# Patient Record
Sex: Male | Born: 1960
Health system: Southern US, Community
[De-identification: ages and names within clinical notes are randomized; demographics above are authoritative.]

## PROBLEM LIST (undated history)

## (undated) DIAGNOSIS — K859 Acute pancreatitis without necrosis or infection, unspecified: Secondary | ICD-10-CM

## (undated) DIAGNOSIS — W3400XA Accidental discharge from unspecified firearms or gun, initial encounter: Secondary | ICD-10-CM

## (undated) DIAGNOSIS — B1920 Unspecified viral hepatitis C without hepatic coma: Secondary | ICD-10-CM

## (undated) HISTORY — PX: OTHER SURGICAL HISTORY: SHX169

---

## 1997-08-10 ENCOUNTER — Emergency Department (HOSPITAL_COMMUNITY): Admission: EM | Admit: 1997-08-10 | Discharge: 1997-08-10 | Payer: Self-pay

## 1997-11-25 ENCOUNTER — Emergency Department (HOSPITAL_COMMUNITY): Admission: EM | Admit: 1997-11-25 | Discharge: 1997-11-25 | Payer: Self-pay | Admitting: Emergency Medicine

## 1998-01-09 ENCOUNTER — Emergency Department (HOSPITAL_COMMUNITY): Admission: EM | Admit: 1998-01-09 | Discharge: 1998-01-09 | Payer: Self-pay | Admitting: Emergency Medicine

## 1998-01-28 ENCOUNTER — Inpatient Hospital Stay (HOSPITAL_COMMUNITY): Admission: EM | Admit: 1998-01-28 | Discharge: 1998-01-30 | Payer: Self-pay | Admitting: Emergency Medicine

## 1998-01-28 ENCOUNTER — Encounter: Payer: Self-pay | Admitting: Emergency Medicine

## 1998-02-26 ENCOUNTER — Ambulatory Visit (HOSPITAL_COMMUNITY): Admission: RE | Admit: 1998-02-26 | Discharge: 1998-02-26 | Payer: Self-pay | Admitting: Gastroenterology

## 1998-02-26 ENCOUNTER — Encounter: Payer: Self-pay | Admitting: Gastroenterology

## 1998-03-03 ENCOUNTER — Ambulatory Visit (HOSPITAL_COMMUNITY): Admission: RE | Admit: 1998-03-03 | Discharge: 1998-03-03 | Payer: Self-pay | Admitting: Gastroenterology

## 1998-06-04 ENCOUNTER — Observation Stay (HOSPITAL_COMMUNITY): Admission: EM | Admit: 1998-06-04 | Discharge: 1998-06-06 | Payer: Self-pay | Admitting: Emergency Medicine

## 1998-06-04 ENCOUNTER — Encounter: Payer: Self-pay | Admitting: *Deleted

## 1998-07-17 ENCOUNTER — Encounter: Payer: Self-pay | Admitting: Emergency Medicine

## 1998-07-17 ENCOUNTER — Inpatient Hospital Stay (HOSPITAL_COMMUNITY): Admission: EM | Admit: 1998-07-17 | Discharge: 1998-07-18 | Payer: Self-pay | Admitting: Emergency Medicine

## 1998-09-06 ENCOUNTER — Inpatient Hospital Stay (HOSPITAL_COMMUNITY): Admission: EM | Admit: 1998-09-06 | Discharge: 1998-09-10 | Payer: Self-pay | Admitting: Emergency Medicine

## 1999-01-27 ENCOUNTER — Emergency Department (HOSPITAL_COMMUNITY): Admission: EM | Admit: 1999-01-27 | Discharge: 1999-01-27 | Payer: Self-pay | Admitting: Emergency Medicine

## 1999-01-27 ENCOUNTER — Encounter: Payer: Self-pay | Admitting: Emergency Medicine

## 1999-03-10 ENCOUNTER — Emergency Department (HOSPITAL_COMMUNITY): Admission: EM | Admit: 1999-03-10 | Discharge: 1999-03-10 | Payer: Self-pay | Admitting: Emergency Medicine

## 1999-05-13 ENCOUNTER — Inpatient Hospital Stay (HOSPITAL_COMMUNITY): Admission: EM | Admit: 1999-05-13 | Discharge: 1999-05-18 | Payer: Self-pay | Admitting: Emergency Medicine

## 1999-05-14 ENCOUNTER — Encounter: Payer: Self-pay | Admitting: Cardiology

## 1999-05-16 ENCOUNTER — Encounter: Payer: Self-pay | Admitting: Internal Medicine

## 1999-07-01 ENCOUNTER — Emergency Department (HOSPITAL_COMMUNITY): Admission: EM | Admit: 1999-07-01 | Discharge: 1999-07-01 | Payer: Self-pay | Admitting: Emergency Medicine

## 1999-08-25 ENCOUNTER — Inpatient Hospital Stay (HOSPITAL_COMMUNITY): Admission: EM | Admit: 1999-08-25 | Discharge: 1999-08-26 | Payer: Self-pay

## 1999-10-08 ENCOUNTER — Observation Stay (HOSPITAL_COMMUNITY): Admission: EM | Admit: 1999-10-08 | Discharge: 1999-10-09 | Payer: Self-pay | Admitting: Emergency Medicine

## 1999-10-30 ENCOUNTER — Emergency Department (HOSPITAL_COMMUNITY): Admission: EM | Admit: 1999-10-30 | Discharge: 1999-10-30 | Payer: Self-pay | Admitting: Emergency Medicine

## 2000-01-24 ENCOUNTER — Emergency Department (HOSPITAL_COMMUNITY): Admission: EM | Admit: 2000-01-24 | Discharge: 2000-01-24 | Payer: Self-pay | Admitting: Internal Medicine

## 2000-01-24 ENCOUNTER — Emergency Department (HOSPITAL_COMMUNITY): Admission: EM | Admit: 2000-01-24 | Discharge: 2000-01-24 | Payer: Self-pay | Admitting: Emergency Medicine

## 2000-02-14 ENCOUNTER — Encounter: Admission: RE | Admit: 2000-02-14 | Discharge: 2000-02-14 | Payer: Self-pay | Admitting: Family Medicine

## 2000-02-14 ENCOUNTER — Encounter: Payer: Self-pay | Admitting: Family Medicine

## 2000-03-03 ENCOUNTER — Emergency Department (HOSPITAL_COMMUNITY): Admission: EM | Admit: 2000-03-03 | Discharge: 2000-03-03 | Payer: Self-pay | Admitting: Emergency Medicine

## 2000-05-31 ENCOUNTER — Inpatient Hospital Stay (HOSPITAL_COMMUNITY): Admission: EM | Admit: 2000-05-31 | Discharge: 2000-06-01 | Payer: Self-pay | Admitting: *Deleted

## 2000-06-07 ENCOUNTER — Inpatient Hospital Stay (HOSPITAL_COMMUNITY): Admission: EM | Admit: 2000-06-07 | Discharge: 2000-06-09 | Payer: Self-pay | Admitting: *Deleted

## 2000-06-08 ENCOUNTER — Encounter: Payer: Self-pay | Admitting: Family Medicine

## 2000-09-12 ENCOUNTER — Inpatient Hospital Stay (HOSPITAL_COMMUNITY): Admission: EM | Admit: 2000-09-12 | Discharge: 2000-09-13 | Payer: Self-pay | Admitting: Emergency Medicine

## 2000-11-21 ENCOUNTER — Emergency Department (HOSPITAL_COMMUNITY): Admission: EM | Admit: 2000-11-21 | Discharge: 2000-11-22 | Payer: Self-pay | Admitting: Emergency Medicine

## 2001-02-12 ENCOUNTER — Encounter: Payer: Self-pay | Admitting: Emergency Medicine

## 2001-02-12 ENCOUNTER — Inpatient Hospital Stay (HOSPITAL_COMMUNITY): Admission: EM | Admit: 2001-02-12 | Discharge: 2001-02-14 | Payer: Self-pay | Admitting: Emergency Medicine

## 2001-06-13 ENCOUNTER — Inpatient Hospital Stay (HOSPITAL_COMMUNITY): Admission: EM | Admit: 2001-06-13 | Discharge: 2001-06-14 | Payer: Self-pay | Admitting: Emergency Medicine

## 2001-06-13 ENCOUNTER — Encounter: Payer: Self-pay | Admitting: Emergency Medicine

## 2001-09-04 ENCOUNTER — Emergency Department (HOSPITAL_COMMUNITY): Admission: EM | Admit: 2001-09-04 | Discharge: 2001-09-04 | Payer: Self-pay

## 2001-09-12 ENCOUNTER — Encounter: Payer: Self-pay | Admitting: Emergency Medicine

## 2001-09-12 ENCOUNTER — Inpatient Hospital Stay (HOSPITAL_COMMUNITY): Admission: EM | Admit: 2001-09-12 | Discharge: 2001-09-15 | Payer: Self-pay | Admitting: Emergency Medicine

## 2001-10-09 ENCOUNTER — Inpatient Hospital Stay (HOSPITAL_COMMUNITY): Admission: EM | Admit: 2001-10-09 | Discharge: 2001-10-11 | Payer: Self-pay | Admitting: *Deleted

## 2001-10-14 ENCOUNTER — Inpatient Hospital Stay (HOSPITAL_COMMUNITY): Admission: EM | Admit: 2001-10-14 | Discharge: 2001-10-17 | Payer: Self-pay | Admitting: Emergency Medicine

## 2001-10-14 ENCOUNTER — Encounter: Payer: Self-pay | Admitting: Emergency Medicine

## 2001-10-16 ENCOUNTER — Encounter: Payer: Self-pay | Admitting: Internal Medicine

## 2001-11-07 ENCOUNTER — Encounter: Payer: Self-pay | Admitting: Emergency Medicine

## 2001-11-08 ENCOUNTER — Inpatient Hospital Stay (HOSPITAL_COMMUNITY): Admission: EM | Admit: 2001-11-08 | Discharge: 2001-11-13 | Payer: Self-pay | Admitting: Internal Medicine

## 2002-01-11 ENCOUNTER — Encounter: Payer: Self-pay | Admitting: Internal Medicine

## 2002-01-11 ENCOUNTER — Inpatient Hospital Stay (HOSPITAL_COMMUNITY): Admission: EM | Admit: 2002-01-11 | Discharge: 2002-01-13 | Payer: Self-pay | Admitting: Emergency Medicine

## 2002-01-22 ENCOUNTER — Inpatient Hospital Stay (HOSPITAL_COMMUNITY): Admission: EM | Admit: 2002-01-22 | Discharge: 2002-01-25 | Payer: Self-pay

## 2002-01-22 ENCOUNTER — Encounter: Payer: Self-pay | Admitting: Internal Medicine

## 2002-01-23 ENCOUNTER — Encounter: Payer: Self-pay | Admitting: Internal Medicine

## 2002-02-01 ENCOUNTER — Emergency Department (HOSPITAL_COMMUNITY): Admission: EM | Admit: 2002-02-01 | Discharge: 2002-02-01 | Payer: Self-pay | Admitting: Emergency Medicine

## 2002-03-27 ENCOUNTER — Inpatient Hospital Stay (HOSPITAL_COMMUNITY): Admission: EM | Admit: 2002-03-27 | Discharge: 2002-03-29 | Payer: Self-pay | Admitting: *Deleted

## 2002-03-27 ENCOUNTER — Encounter: Payer: Self-pay | Admitting: Internal Medicine

## 2002-04-03 ENCOUNTER — Emergency Department (HOSPITAL_COMMUNITY): Admission: EM | Admit: 2002-04-03 | Discharge: 2002-04-03 | Payer: Self-pay | Admitting: Emergency Medicine

## 2002-04-11 ENCOUNTER — Emergency Department (HOSPITAL_COMMUNITY): Admission: EM | Admit: 2002-04-11 | Discharge: 2002-04-11 | Payer: Self-pay | Admitting: Emergency Medicine

## 2002-07-02 ENCOUNTER — Inpatient Hospital Stay (HOSPITAL_COMMUNITY): Admission: EM | Admit: 2002-07-02 | Discharge: 2002-07-07 | Payer: Self-pay | Admitting: Emergency Medicine

## 2002-10-02 ENCOUNTER — Inpatient Hospital Stay (HOSPITAL_COMMUNITY): Admission: EM | Admit: 2002-10-02 | Discharge: 2002-10-04 | Payer: Self-pay | Admitting: Emergency Medicine

## 2003-01-09 ENCOUNTER — Inpatient Hospital Stay (HOSPITAL_COMMUNITY): Admission: EM | Admit: 2003-01-09 | Discharge: 2003-01-11 | Payer: Self-pay | Admitting: Emergency Medicine

## 2003-01-14 ENCOUNTER — Inpatient Hospital Stay (HOSPITAL_COMMUNITY): Admission: EM | Admit: 2003-01-14 | Discharge: 2003-01-17 | Payer: Self-pay | Admitting: Internal Medicine

## 2003-03-16 ENCOUNTER — Emergency Department (HOSPITAL_COMMUNITY): Admission: EM | Admit: 2003-03-16 | Discharge: 2003-03-16 | Payer: Self-pay | Admitting: Emergency Medicine

## 2003-04-14 ENCOUNTER — Inpatient Hospital Stay (HOSPITAL_COMMUNITY): Admission: EM | Admit: 2003-04-14 | Discharge: 2003-04-17 | Payer: Self-pay | Admitting: Emergency Medicine

## 2003-05-16 ENCOUNTER — Inpatient Hospital Stay (HOSPITAL_COMMUNITY): Admission: EM | Admit: 2003-05-16 | Discharge: 2003-05-19 | Payer: Self-pay | Admitting: Emergency Medicine

## 2003-06-18 ENCOUNTER — Emergency Department (HOSPITAL_COMMUNITY): Admission: EM | Admit: 2003-06-18 | Discharge: 2003-06-19 | Payer: Self-pay | Admitting: Emergency Medicine

## 2003-07-25 ENCOUNTER — Emergency Department (HOSPITAL_COMMUNITY): Admission: EM | Admit: 2003-07-25 | Discharge: 2003-07-25 | Payer: Self-pay | Admitting: Emergency Medicine

## 2003-08-11 ENCOUNTER — Encounter
Admission: RE | Admit: 2003-08-11 | Discharge: 2003-11-09 | Payer: Self-pay | Admitting: Physical Medicine & Rehabilitation

## 2003-08-27 ENCOUNTER — Inpatient Hospital Stay (HOSPITAL_COMMUNITY): Admission: EM | Admit: 2003-08-27 | Discharge: 2003-08-29 | Payer: Self-pay | Admitting: Emergency Medicine

## 2003-09-23 ENCOUNTER — Ambulatory Visit: Payer: Self-pay | Admitting: Anesthesiology

## 2003-10-28 ENCOUNTER — Ambulatory Visit: Payer: Self-pay | Admitting: Anesthesiology

## 2004-03-02 ENCOUNTER — Inpatient Hospital Stay (HOSPITAL_COMMUNITY): Admission: EM | Admit: 2004-03-02 | Discharge: 2004-03-04 | Payer: Self-pay | Admitting: Emergency Medicine

## 2007-12-01 ENCOUNTER — Inpatient Hospital Stay (HOSPITAL_COMMUNITY): Admission: EM | Admit: 2007-12-01 | Discharge: 2007-12-06 | Payer: Self-pay | Admitting: Emergency Medicine

## 2009-05-14 ENCOUNTER — Ambulatory Visit: Payer: Self-pay | Admitting: Gastroenterology

## 2009-08-29 ENCOUNTER — Emergency Department (HOSPITAL_COMMUNITY): Admission: EM | Admit: 2009-08-29 | Discharge: 2009-08-30 | Payer: Self-pay | Admitting: Emergency Medicine

## 2010-01-24 ENCOUNTER — Encounter: Payer: Self-pay | Admitting: Gastroenterology

## 2010-01-30 ENCOUNTER — Inpatient Hospital Stay (HOSPITAL_COMMUNITY)
Admission: EM | Admit: 2010-01-30 | Discharge: 2010-02-02 | Disposition: A | Discharge status: Home / Self Care | Payer: Self-pay | Source: Home / Self Care | Attending: Internal Medicine | Admitting: Internal Medicine

## 2010-01-30 LAB — COMPREHENSIVE METABOLIC PANEL
AST: 85 U/L — ABNORMAL HIGH (ref 0–37)
Albumin: 4.9 g/dL (ref 3.5–5.2)
Calcium: 9.7 mg/dL (ref 8.4–10.5)
Chloride: 101 mEq/L (ref 96–112)
Creatinine, Ser: 1.14 mg/dL (ref 0.4–1.5)
GFR calc Af Amer: >60 mL/min (ref >60)
Sodium: 140 mEq/L (ref 135–145)

## 2010-01-30 LAB — CBC
HCT: 44.4 % (ref 39.0–52.0)
MCH: 31.5 pg (ref 26.0–34.0)
MCHC: 34.9 g/dL (ref 30.0–36.0)
MCV: 90.2 fL (ref 78.0–100.0)
RDW: 13.7 % (ref 11.5–15.5)

## 2010-01-30 LAB — DIFFERENTIAL
Eosinophils Relative: 1 % (ref 0–5)
Lymphocytes Relative: 28 % (ref 12–46)
Lymphs Abs: 1.7 10*3/uL (ref 0.7–4.0)
Monocytes Absolute: 0.3 10*3/uL (ref 0.1–1.0)

## 2010-01-31 LAB — CBC
Hemoglobin: 13.8 g/dL (ref 13.0–17.0)
MCH: 30.6 pg (ref 26.0–34.0)
MCV: 90.5 fL (ref 78.0–100.0)
RBC: 4.51 MIL/uL (ref 4.22–5.81)

## 2010-01-31 LAB — COMPREHENSIVE METABOLIC PANEL
AST: 66 U/L — ABNORMAL HIGH (ref 0–37)
CO2: 27 mEq/L (ref 19–32)
Chloride: 103 mEq/L (ref 96–112)
Creatinine, Ser: 1.15 mg/dL (ref 0.4–1.5)
GFR calc Af Amer: >60 mL/min (ref >60)
GFR calc non Af Amer: >60 mL/min (ref >60)
Total Bilirubin: 1.4 mg/dL — ABNORMAL HIGH (ref 0.3–1.2)

## 2010-02-01 LAB — URINALYSIS, ROUTINE W REFLEX MICROSCOPIC
Specific Gravity, Urine: 1.009 (ref 1.005–1.030)
Urine Glucose, Fasting: NEGATIVE mg/dL
pH: 6.5 (ref 5.0–8.0)

## 2010-02-01 LAB — COMPREHENSIVE METABOLIC PANEL
Albumin: 3.3 g/dL — ABNORMAL LOW (ref 3.5–5.2)
Alkaline Phosphatase: 54 U/L (ref 39–117)
BUN: 11 mg/dL (ref 6–23)
Calcium: 8.7 mg/dL (ref 8.4–10.5)
Creatinine, Ser: 1.22 mg/dL (ref 0.4–1.5)
Potassium: 4.6 mEq/L (ref 3.5–5.1)
Total Protein: 7.1 g/dL (ref 6.0–8.3)

## 2010-02-02 LAB — COMPREHENSIVE METABOLIC PANEL
ALT: 71 U/L — ABNORMAL HIGH (ref 0–53)
AST: 64 U/L — ABNORMAL HIGH (ref 0–37)
Alkaline Phosphatase: 54 U/L (ref 39–117)
CO2: 29 mEq/L (ref 19–32)
GFR calc Af Amer: >60 mL/min (ref >60)
GFR calc non Af Amer: >60 mL/min (ref >60)
Glucose, Bld: 89 mg/dL (ref 70–99)
Potassium: 4.6 mEq/L (ref 3.5–5.1)
Sodium: 140 mEq/L (ref 135–145)

## 2010-02-02 LAB — CBC
HCT: 35.9 % — ABNORMAL LOW (ref 39.0–52.0)
Hemoglobin: 12.1 g/dL — ABNORMAL LOW (ref 13.0–17.0)
RBC: 4 MIL/uL — ABNORMAL LOW (ref 4.22–5.81)
WBC: 3.6 10*3/uL — ABNORMAL LOW (ref 4.0–10.5)
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
## 2010-03-19 LAB — DIFFERENTIAL
Basophils Absolute: 0 10*3/uL (ref 0.0–0.1)
Lymphocytes Relative: 19 % (ref 12–46)
Lymphs Abs: 1.3 10*3/uL (ref 0.7–4.0)
Neutro Abs: 5.2 10*3/uL (ref 1.7–7.7)
Neutrophils Relative %: 74 % (ref 43–77)

## 2010-03-19 LAB — CBC
Hemoglobin: 14.6 g/dL (ref 13.0–17.0)
MCH: 31.5 pg (ref 26.0–34.0)
MCHC: 34.3 g/dL (ref 30.0–36.0)
MCV: 91.9 fL (ref 78.0–100.0)
RBC: 4.65 MIL/uL (ref 4.22–5.81)

## 2010-03-19 LAB — COMPREHENSIVE METABOLIC PANEL
BUN: 14 mg/dL (ref 6–23)
CO2: 30 mEq/L (ref 19–32)
Calcium: 9.2 mg/dL (ref 8.4–10.5)
Chloride: 104 mEq/L (ref 96–112)
Creatinine, Ser: 1.18 mg/dL (ref 0.4–1.5)
GFR calc non Af Amer: >60 mL/min (ref >60)
Total Bilirubin: 1.1 mg/dL (ref 0.3–1.2)

## 2010-03-19 LAB — LIPASE, BLOOD: Lipase: 536 U/L — ABNORMAL HIGH (ref 11–59)
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
## 2010-05-21 NOTE — Discharge Summary (Signed)
   NAME:  [name], [name]                          ACCOUNT NO.:  [id number]   MEDICAL RECORD NO.:  [id number]                   PATIENT TYPE:  INP   LOCATION:  5524                                 FACILITY:  [facility]   PHYSICIAN:  Merlene Laughter. Renae Gloss, M.D.           DATE OF BIRTH:  September 19, 1960   DATE OF ADMISSION:  11/08/2001  DATE OF DISCHARGE:  11/13/2001                                 DISCHARGE SUMMARY   DISCHARGE DIAGNOSES:  1. Acute pancreatitis.  2. Constipation.   HOSPITAL COURSE:  The patient was admitted for further evaluation and  treatment of acute pancreatitis.  He has history of  chronic pancreatitis as  a result of an abdominal injury years ago.  Over the last several months,  however, he noted recurrent of acute symptoms, which included nausea,  vomiting and mid epigastric abdominal pain.  Upon admission, his lipase was  261 with a amylase of 459.  He tolerated the Demerol and Duragesic patch  without complications. At the time of his discharge the amylase was 70 with  a lipase of 22.  He was able to eat a regular meal without complications.  He stated that his pain was at baseline and he was able to control his pain  with his outpatient oral regimen.   He had constipation during this admission which was relieved with Dulcolax  and enema.  Constipation is most likely secondary to narcotic medications.   DISCHARGE MEDICATIONS:  Duragesic patch 100 micrograms every three days.   FOLLOW UP:  The patient will be seen by Dr. Andi Devon within two  weeks following discharge.                                               Merlene Laughter. Renae Gloss, M.D.    KRS/MEDQ  D:  01/24/2002  T:  01/25/2002  Job:  [id number]
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
## 2010-05-21 NOTE — Discharge Summary (Signed)
NAME:  ERROLL, WILBOURNE                          ACCOUNT NO.:  [id number]   MEDICAL RECORD NO.:  [id number]                   PATIENT TYPE:  INP   LOCATION:  0467                                 FACILITY:  [facility]   PHYSICIAN:  Merlene Laughter. Renae Gloss, M.D.           DATE OF BIRTH:  December 29, 1960   DATE OF ADMISSION:  05/16/2003  DATE OF DISCHARGE:  05/19/2003                                 DISCHARGE SUMMARY   DISCHARGE DIAGNOSIS:  Acute pancreatitis.   HOSPITAL COURSE:  Mr. Vazquez was admitted for further treatment of acute  pancreatitis. He has a history of chronic pancreatitis with acute  exacerbations with his most recent hospitalization on April 17, 2003. He was  able to tolerate oral intake and his pain resolved with IV Demerol.   At the time of discharge, Mr. Fanning was pain-free and he was able to tolerate  a regular diet.   DISCHARGE MEDICATIONS:  1. Methadone 10 mg p.o. b.i.d. p.r.n.  2. Mepergan one p.o. b.i.d. p.r.n.   FOLLOWUP:  Mr. President will be seen by Dr. Andi Devon within two weeks  following discharge.                                               Merlene Laughter. Renae Gloss, M.D.    KRS/MEDQ  D:  05/19/2003  T:  05/20/2003  Job:  [id number]
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
## 2010-05-21 NOTE — Discharge Summary (Signed)
   NAME:  [name], David                          ACCOUNT NO.:  [id number]   MEDICAL RECORD NO.:  [id number]                   PATIENT TYPE:  INP   LOCATION:  0357                                 FACILITY:  [facility]   PHYSICIAN:  Merlene Laughter. Renae Gloss, M.D.           DATE OF BIRTH:  Mar 19, 1960   DATE OF ADMISSION:  03/27/2002  DATE OF DISCHARGE:  03/29/2002                                 DISCHARGE SUMMARY   DISCHARGE DIAGNOSES:  1. Acute pancreatitis.  2. Epigastric abdominal pain secondary to #1.   HOSPITAL COURSE:  The patient was admitted with acute exacerbation of  pancreatitis.  He does have a history of chronic pancreatitis with chronic  pain syndrome requiring low-dose methadone as an outpatient.  Upon  admission, he had an amylase of 344, lipase of 232.  With bowel rest and IV  narcotics, his symptoms did resolve.  His discharge amylase and lipase were  82 and 27, respectively.   DISPOSITION:  The patient was tolerating oral intake, was ambulating without  complications.   DISCHARGE MEDICATIONS:  Methadone 10 mg p.o. b.i.d. p.r.n.   FOLLOWUP:  The patient will be seen by Dr. Renae Gloss within two weeks  following discharge.                                               Merlene Laughter. Renae Gloss, M.D.    KRS/MEDQ  D:  04/25/2002  T:  04/25/2002  Job:  [id number]
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
## 2010-05-21 NOTE — Procedures (Signed)
[name]                ACCOUNT NO.:  [id number]   MEDICAL RECORD NO.:  [id number]          PATIENT TYPE:  REC   LOCATION:  TPC                          FACILITY:  [facility]   PHYSICIAN:  Celene Kras, MD        DATE OF BIRTH:  02/23/1960   DATE OF PROCEDURE:  09/23/2003  DATE OF DISCHARGE:                                 OPERATIVE REPORT   PATIENT:  [name].   DATE OF BIRTH:  [birth year].   SURGEON:  Jewel Baize. Stevphen Rochester, M.D.   [name] and I evaluated  him. I have reviewed the Health and history form. I reviewed the 14 point  review of systems.   1.  [name] comes to [place] today for his scheduled celiac plexus block,      aortorenal plexus block. This procedure is discussed with him within the      context of risks, complications, and options, and he wishes to proceed.      Predicate further injection based on need. Symptoms directed left      greater than right. No interval change.  2.  Utilizing medications sparingly and appropriately. Primarily is rescue      but as per prescribed. I reviewed these risks.  3.  Consider second injection if warranted. Consider ablation.   OBJECTIVE:  No significant change in neurological or musculoskeletal  presentation.   Incisionally, he is tender, myofascial presentation. Abdominal pain is  noted, mostly epigastrium and upper quadrant.   IMPRESSION:  Pancreatitis, chronic abdominal pain.   PLAN:  Celiac plexus block, aortorenal plexus component. He is consented.   The patient is taken to fluoroscopy suite and placed in prone position. Back  prepped and draped in usual fashion. Using a 22-gauge spinal needle under  local anesthetic, I advanced to the anterior surface of L1-2 with CSF, heme,  or paresthesia. There is no evidence that we have encroached upon neural  structures, and we confirmed placement of multiple fluoroscopic positions. I  then test blocked uneventfully and  followed with 4 cc of lidocaine 1% MPF  and 40 mg of Aristocort.   Contrast is not utilized secondary to allergy, although ___________, he is  most concerned. Needle placement was adequately assessed prior to injection.   He tolerated the procedure well. No complication from our procedure. Will  assess him in context of activities of daily living. We will see him in  followup.       HH/MEDQ  D:  09/23/2003 19:02:56  T:  09/25/2003 07:42:35  Job:  [id number]
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
## 2010-05-21 NOTE — Discharge Summary (Signed)
   NAME:  [name], [name]                          ACCOUNT NO.:  [id number]   MEDICAL RECORD NO.:  [id number]                   PATIENT TYPE:  INP   LOCATION:  0444                                 FACILITY:  [facility]   PHYSICIAN:  Georgina Quint. Plotnikov, M.D. [facility]      DATE OF BIRTH:  01/12/1960   DATE OF ADMISSION:  09/12/2001  DATE OF DISCHARGE:  09/15/2001                                 DISCHARGE SUMMARY   FINAL DIAGNOSES:  1. Recurrent exacerbation of chronic pancreatitis.  2. Abdominal pain due to problem #1.  3. History of hepatitis C.  4. History of mild thrombocytopenia.   HISTORY OF PRESENT ILLNESS:  The patient is a 50 year old male with chronic  pancreatitis currently, worked up previously at [facility] with a chronic  pain management here in [place] [employer] by Dr. Hal Hope.   CURRENT MEDICATIONS:  1. Methadone 10 mg one to two a day.  2. Mepergan 50/25 p.r.n.   HISTORY:  The patient is a 50 year old male with the above problems who  presented with exacerbation of abdominal pain on September 12, 2001.  For  the details, please address the History and Physical.   HOSPITAL COURSE:  During the course of hospitalization, he was treated with  IV Demerol and IV fluids, and IV Phenergan.  His condition has gradually  improved.  The lab work revealed elevated amylase and lipase.  It has  normalized.  On the day of discharge, he was doing well, temp 99.1, heart  rate 75, respirations 20, blood pressure 109/75.  He is feeling well, able  to eat and drink, abdomen soft and nontender, lungs clear, heart rate  regular, lower extremities without edema.  He was alert and cooperative.   LABORATORY AND ACCESSORY DATA:  White count 3.4, platelets 118, BMET normal,  hemoglobin 11.9 (was 13).  Initial amylase 515 with lipase 326, SGOT 78,  SGPT 79.  Urinalysis was negative.   His chest x-ray was clear.    SPECIAL INSTRUCTIONS:  1. GI consultation at [facility] with gastroenterology at his  request next     week.  2. Follow up with Dr. Hal Hope at pain clinic as usual.                                               Georgina Quint. Plotnikov, M.D. [facility]    AVP/MEDQ  D:  09/15/2001  T:  09/15/2001  Job:  [id number]   cc:   GI/Gastroenterology   Dr. Hal Hope  [facility] Clinic
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
## 2010-05-21 NOTE — Discharge Summary (Signed)
NAME:  [name], [name]                          ACCOUNT NO.:  [id number]   MEDICAL RECORD NO.:  [id number]                   PATIENT TYPE:  INP   LOCATION:  0450                                 FACILITY:  [facility]   PHYSICIAN:  Olene Craven, M.D.            DATE OF BIRTH:  03/04/1960   DATE OF ADMISSION:  01/08/2003  DATE OF DISCHARGE:  01/11/2003                                 DISCHARGE SUMMARY   DISCHARGE DIAGNOSIS:  Acute on chronic pancreatitis.   DISCHARGE MEDICATIONS:  1. Methadone 10 mg p.o. b.i.d.  2. Mepergan one tablet p.o. q.6h. p.r.n. breakthrough.  3. Phenergan 25 mg p.o. q.6h. p.r.n. breakthrough.   FOLLOWUP:  The patient is to keep his regularly scheduled appointment with  Dr. Andi Devon.   PROCEDURES:  None.   CONSULTATIONS:  None.   HOSPITAL COURSE:  The patient was admitted on January 09, 2003, after  experiencing worsening of abdominal discomfort, nausea and vomiting.  His  home medications were unable to alleviate the pain.  He was admitted for  further evaluation and noted to have elevated SGOT, SGPT, and lipase.  The  patient has had multiple visits for acute on chronic pancreatitis.  This is  similar to his previous admissions.  The patient responded rapidly and  appropriately with n.p.o. and IV hydration.  He was able to tolerate clear  liquids within the day of admission, and was discharged the following  morning on regular food.  It was not felt that a CT scan looking for  pseudocyst was needed at this time.  The patient is well rehearsed in  symptoms of pancreatitis, has had multiple admissions.  He knows if symptoms  worsen or do not improve he is to return to the office.  The patient is  discharged in stable condition.                                               Olene Craven, M.D.    DEH/MEDQ  D:  01/11/2003  T:  01/11/2003  Job:  [id number]
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
## 2010-05-21 NOTE — Procedures (Signed)
[name], [name]                ACCOUNT NO.:  [id number]   MEDICAL RECORD NO.:  [id number]          PATIENT TYPE:  REC   LOCATION:  TPC                          FACILITY:  [facility]   PHYSICIAN:  Celene Kras, MD        DATE OF BIRTH:  [birth year]   DATE OF PROCEDURE:  10/28/2003  DATE OF DISCHARGE:                                 OPERATIVE REPORT   PATIENT:  [name].   DATE OF BIRTH:  [birth year].   SURGEON:  Jewel Baize. Stevphen Rochester, M.D.   [name].  I evaluated him  via health and history form and 14-point review of systems.   1.  [name] has had improvement with the celiac plexus block, he has not had      to go to the ER, he has less of the 9 and significant pain that he has      had in the past.  2.  He is down to 10 mg of methadone, as he has brought himself down, and I      actually would like to switch him to an oxycodone preparation.  That      would be used p.r.n.  3.  I review methadone and its inherent risks.  4.  Consider another block if warranted, but if he is doing fine in three or      four weeks, we will probably just follow him along expectantly as a      reinforcement to the block should he have significant breakthrough.      Again, the risks, complications, and options of this procedure were      reviewed.  He wishes to proceed.   OBJECTIVE:  No significant change in neurologic or musculoskeletal  presentation.  Appears quiescent.  No peri-incisional discomfort, abdominal  pain modestly present, but no other changes noted.   IMPRESSION:  1.  Pancreatitis and diffuse abdominal pain, etiology unspecified.  2.  Abdominal pain possibly secondary to adhesions, results in central      amplification.  3.  Cannot rule out, although not a clear diagnosis, reflex sympathetic      dystrophy of the gut.  4.  He is consented for today's procedure.   PROCEDURE:  The patient is taken to the fluoroscopy suite and placed in  prone position, back prepped and draped in the usual fashion.  Using a 22-  gauge spinal needle, I advanced to the anterior surface of L2, without CSF,  heme, or paresthesia.  This is under local anesthetic.  The needle is then  migrated closer to L1, and Isovue 200 is utilized in multiple fluoroscopic  positions to confirm placement.  Test block uneventfully.  I follow with 6  mL of lidocaine 1% NPF and 40 mg of Aristocort.   He is appropriately recovered with vital signs stable and discharge  instructions are given.  No complications identified.  He will assess within  the context of activities of daily living, and we will see him in three  weeks.  HH/MEDQ  D:  10/28/2003 11:18:36  T:  10/28/2003 13:25:26  Job:  [id number]

## 2010-10-05 LAB — URINALYSIS, ROUTINE W REFLEX MICROSCOPIC
Bilirubin Urine: NEGATIVE
Leukocytes, UA: NEGATIVE
Nitrite: NEGATIVE
Specific Gravity, Urine: 1.027 (ref 1.005–1.030)
Urobilinogen, UA: 1 mg/dL (ref 0.0–1.0)

## 2010-10-05 LAB — MAGNESIUM: Magnesium: 2 mg/dL (ref 1.5–2.5)

## 2010-10-05 LAB — COMPREHENSIVE METABOLIC PANEL
Albumin: 3.3 g/dL — ABNORMAL LOW (ref 3.5–5.2)
Alkaline Phosphatase: 54 U/L (ref 39–117)
BUN: 5 mg/dL — ABNORMAL LOW (ref 6–23)
BUN: 8 mg/dL (ref 6–23)
Calcium: 9 mg/dL (ref 8.4–10.5)
Chloride: 100 mEq/L (ref 96–112)
Chloride: 104 mEq/L (ref 96–112)
Creatinine, Ser: 1.09 mg/dL (ref 0.4–1.5)
GFR calc Af Amer: >60 mL/min (ref >60)
GFR calc non Af Amer: >60 mL/min (ref >60)
Glucose, Bld: 112 mg/dL — ABNORMAL HIGH (ref 70–99)
Potassium: 3.7 mEq/L (ref 3.5–5.1)
Total Bilirubin: 0.7 mg/dL (ref 0.3–1.2)
Total Bilirubin: 0.8 mg/dL (ref 0.3–1.2)

## 2010-10-05 LAB — BASIC METABOLIC PANEL
BUN: 10 mg/dL (ref 6–23)
Calcium: 9.5 mg/dL (ref 8.4–10.5)
Chloride: 103 mEq/L (ref 96–112)
Creatinine, Ser: 1.23 mg/dL (ref 0.4–1.5)

## 2010-10-05 LAB — URINE MICROSCOPIC-ADD ON

## 2010-10-05 LAB — LIPID PANEL
LDL Cholesterol: 108 mg/dL — ABNORMAL HIGH (ref 0–99)
VLDL: 16 mg/dL (ref 0–40)

## 2010-10-05 LAB — CBC
HCT: 36.6 % — ABNORMAL LOW (ref 39.0–52.0)
Hemoglobin: 12.3 g/dL — ABNORMAL LOW (ref 13.0–17.0)
Hemoglobin: 12.9 g/dL — ABNORMAL LOW (ref 13.0–17.0)
Platelets: 158 10*3/uL (ref 150–400)
RBC: 4.04 MIL/uL — ABNORMAL LOW (ref 4.22–5.81)
RDW: 13 % (ref 11.5–15.5)
WBC: 4.6 10*3/uL (ref 4.0–10.5)

## 2010-10-05 LAB — DIFFERENTIAL
Basophils Absolute: 0 10*3/uL (ref 0.0–0.1)
Basophils Relative: 0 % (ref 0–1)
Neutro Abs: 2.1 10*3/uL (ref 1.7–7.7)
Neutrophils Relative %: 46 % (ref 43–77)

## 2010-10-05 LAB — HEPATIC FUNCTION PANEL
Alkaline Phosphatase: 62 U/L (ref 39–117)
Indirect Bilirubin: 0.5 mg/dL (ref 0.3–0.9)
Total Protein: 8.3 g/dL (ref 6.0–8.3)

## 2010-10-05 LAB — LIPASE, BLOOD: Lipase: 592 U/L — ABNORMAL HIGH (ref 11–59)

## 2010-10-05 LAB — AMYLASE: Amylase: 343 U/L — ABNORMAL HIGH (ref 27–131)

## 2010-10-08 LAB — CBC
Hemoglobin: 12.8 g/dL — ABNORMAL LOW (ref 13.0–17.0)
RBC: 4.09 MIL/uL — ABNORMAL LOW (ref 4.22–5.81)
WBC: 4.6 10*3/uL (ref 4.0–10.5)

## 2010-10-08 LAB — BASIC METABOLIC PANEL
Calcium: 8.9 mg/dL (ref 8.4–10.5)
Chloride: 103 mEq/L (ref 96–112)
Creatinine, Ser: 1.08 mg/dL (ref 0.4–1.5)
GFR calc Af Amer: >60 mL/min (ref >60)
Sodium: 135 mEq/L (ref 135–145)

## 2010-10-08 LAB — LIPASE, BLOOD: Lipase: 47 U/L (ref 11–59)

## 2011-05-10 ENCOUNTER — Encounter (HOSPITAL_COMMUNITY): Payer: Self-pay | Admitting: Emergency Medicine

## 2011-05-10 ENCOUNTER — Emergency Department (HOSPITAL_COMMUNITY)
Admission: EM | Admit: 2011-05-10 | Discharge: 2011-05-10 | Disposition: A | Discharge status: Home / Self Care | Payer: 59 | Attending: Emergency Medicine | Admitting: Emergency Medicine

## 2011-05-10 DIAGNOSIS — K5900 Constipation, unspecified: Secondary | ICD-10-CM | POA: Insufficient documentation

## 2011-05-10 DIAGNOSIS — K861 Other chronic pancreatitis: Secondary | ICD-10-CM

## 2011-05-10 DIAGNOSIS — Z79899 Other long term (current) drug therapy: Secondary | ICD-10-CM | POA: Insufficient documentation

## 2011-05-10 DIAGNOSIS — R110 Nausea: Secondary | ICD-10-CM | POA: Insufficient documentation

## 2011-05-10 DIAGNOSIS — R1013 Epigastric pain: Secondary | ICD-10-CM | POA: Insufficient documentation

## 2011-05-10 HISTORY — DX: Acute pancreatitis without necrosis or infection, unspecified: K85.90

## 2011-05-10 HISTORY — DX: Accidental discharge from unspecified firearms or gun, initial encounter: W34.00XA

## 2011-05-10 LAB — DIFFERENTIAL
Eosinophils Relative: 1 % (ref 0–5)
Lymphocytes Relative: 47 % — ABNORMAL HIGH (ref 12–46)
Lymphs Abs: 2.4 10*3/uL (ref 0.7–4.0)
Monocytes Absolute: 0.5 10*3/uL (ref 0.1–1.0)
Monocytes Relative: 10 % (ref 3–12)
Neutro Abs: 2.2 10*3/uL (ref 1.7–7.7)

## 2011-05-10 LAB — COMPREHENSIVE METABOLIC PANEL
AST: 48 U/L — ABNORMAL HIGH (ref 0–37)
Albumin: 4.4 g/dL (ref 3.5–5.2)
Calcium: 9.3 mg/dL (ref 8.4–10.5)
Chloride: 97 mEq/L (ref 96–112)
Creatinine, Ser: 1.02 mg/dL (ref 0.50–1.35)
Total Bilirubin: 1.2 mg/dL (ref 0.3–1.2)
Total Protein: 9.5 g/dL — ABNORMAL HIGH (ref 6.0–8.3)

## 2011-05-10 LAB — CBC
HCT: 43.6 % (ref 39.0–52.0)
Hemoglobin: 15.2 g/dL (ref 13.0–17.0)
MCV: 90.1 fL (ref 78.0–100.0)
RBC: 4.84 MIL/uL (ref 4.22–5.81)
WBC: 5.2 10*3/uL (ref 4.0–10.5)

## 2011-05-10 MED ORDER — PROMETHAZINE HCL 25 MG PO TABS: 25.0000 mg | ORAL_TABLET | Freq: Four times a day (QID) | ORAL | Status: DC | PRN

## 2011-05-10 MED ORDER — HYDROMORPHONE HCL 2 MG PO TABS: 2.0000 mg | ORAL_TABLET | ORAL | Status: AC | PRN

## 2011-05-10 MED ORDER — HYDROMORPHONE HCL PF 2 MG/ML IJ SOLN
2.0000 mg | Freq: Once | INTRAMUSCULAR | Status: AC
  Administered 2011-05-10: 2 mg via INTRAVENOUS
  Filled 2011-05-10: qty 1

## 2011-05-10 MED ORDER — PROMETHAZINE HCL 25 MG/ML IJ SOLN
25.0000 mg | Freq: Once | INTRAMUSCULAR | Status: AC
  Administered 2011-05-10: 25 mg via INTRAVENOUS
  Filled 2011-05-10: qty 1

## 2011-05-10 MED ORDER — SODIUM CHLORIDE 0.9 % IV BOLUS (SEPSIS)
1000.0000 mL | Freq: Once | INTRAVENOUS | Status: AC
  Administered 2011-05-10: 1000 mL via INTRAVENOUS
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
## 2011-05-10 NOTE — ED Notes (Signed)
Pt states he has pancreatitis and began to have pain on Friday  Pt states he takes methadone for the pain and when it does not control his pain he comes in  Pt has nausea without vomiting

## 2011-05-10 NOTE — ED Notes (Signed)
Pt's RA 02 dropped to 88 while patient was sleeping, placed Bryant on pt at 2L, 02 is now 100%

## 2011-12-17 ENCOUNTER — Emergency Department (HOSPITAL_COMMUNITY): Payer: 59

## 2011-12-17 ENCOUNTER — Emergency Department (HOSPITAL_COMMUNITY)
Admission: EM | Admit: 2011-12-17 | Discharge: 2011-12-18 | Disposition: A | Discharge status: Home / Self Care | Payer: 59 | Attending: Emergency Medicine | Admitting: Emergency Medicine

## 2011-12-17 ENCOUNTER — Encounter (HOSPITAL_COMMUNITY): Payer: Self-pay | Admitting: *Deleted

## 2011-12-17 DIAGNOSIS — Z87828 Personal history of other (healed) physical injury and trauma: Secondary | ICD-10-CM | POA: Insufficient documentation

## 2011-12-17 DIAGNOSIS — K859 Acute pancreatitis without necrosis or infection, unspecified: Secondary | ICD-10-CM | POA: Insufficient documentation

## 2011-12-17 LAB — CBC WITH DIFFERENTIAL/PLATELET
Basophils Absolute: 0 10*3/uL (ref 0.0–0.1)
Basophils Relative: 0 % (ref 0–1)
Eosinophils Relative: 0 % (ref 0–5)
HCT: 44 % (ref 39.0–52.0)
Hemoglobin: 15.3 g/dL (ref 13.0–17.0)
MCHC: 34.8 g/dL (ref 30.0–36.0)
MCV: 87.8 fL (ref 78.0–100.0)
Monocytes Absolute: 0.5 10*3/uL (ref 0.1–1.0)
Monocytes Relative: 6 % (ref 3–12)
RDW: 12.8 % (ref 11.5–15.5)

## 2011-12-17 LAB — COMPREHENSIVE METABOLIC PANEL
AST: 56 U/L — ABNORMAL HIGH (ref 0–37)
BUN: 14 mg/dL (ref 6–23)
CO2: 28 mEq/L (ref 19–32)
Calcium: 10 mg/dL (ref 8.4–10.5)
Chloride: 96 mEq/L (ref 96–112)
Creatinine, Ser: 1.1 mg/dL (ref 0.50–1.35)
GFR calc non Af Amer: 76 mL/min — ABNORMAL LOW (ref >90)
Total Bilirubin: 0.8 mg/dL (ref 0.3–1.2)

## 2011-12-17 LAB — LIPASE, BLOOD: Lipase: 214 U/L — ABNORMAL HIGH (ref 11–59)

## 2011-12-17 MED ORDER — HYDROMORPHONE HCL PF 1 MG/ML IJ SOLN
1.0000 mg | Freq: Once | INTRAMUSCULAR | Status: AC
  Administered 2011-12-17: 1 mg via INTRAVENOUS
  Filled 2011-12-17: qty 1

## 2011-12-17 MED ORDER — HYDROMORPHONE HCL PF 1 MG/ML IJ SOLN
2.0000 mg | Freq: Once | INTRAMUSCULAR | Status: AC
  Administered 2011-12-17: 2 mg via INTRAVENOUS
  Filled 2011-12-17: qty 2

## 2011-12-17 MED ORDER — SODIUM CHLORIDE 0.9 % IV SOLN
INTRAVENOUS | Status: DC
  Administered 2011-12-17 (×2): via INTRAVENOUS

## 2011-12-17 MED ORDER — ONDANSETRON HCL 4 MG/2ML IJ SOLN
4.0000 mg | Freq: Once | INTRAMUSCULAR | Status: AC
  Administered 2011-12-17: 4 mg via INTRAVENOUS
  Filled 2011-12-17: qty 2

## 2011-12-17 MED ORDER — ONDANSETRON 8 MG PO TBDP: 8.0000 mg | ORAL_TABLET | Freq: Three times a day (TID) | ORAL | Status: DC | PRN

## 2011-12-17 MED ORDER — OXYCODONE-ACETAMINOPHEN 7.5-500 MG PO TABS: 1.0000 | ORAL_TABLET | ORAL | Status: DC | PRN

## 2011-12-17 NOTE — ED Notes (Signed)
IV attempt x2, unsuccessful. Barbara Cower, RN requested to bedside for assistance.
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
## 2011-12-17 NOTE — ED Notes (Signed)
MD at bedside. 
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
## 2011-12-17 NOTE — ED Notes (Signed)
Patient completed contrast.

## 2011-12-17 NOTE — ED Notes (Signed)
Dr Freida Busman reported that patient was difficult to arouse, requested patient be placed on monitor. Patient O2 sat decreased 65-70% initially, patient placed on 10L O2 via face mask and titrated down to 5L after 3 minutes. Patient currently on 3L O2 via [place]. O2 sats 100% at this time.

## 2011-12-18 MED ORDER — OXYCODONE-ACETAMINOPHEN 5-325 MG PO TABS
2.0000 | ORAL_TABLET | Freq: Once | ORAL | Status: AC
  Administered 2011-12-18: 2 via ORAL
  Filled 2011-12-18: qty 2

## 2011-12-18 NOTE — ED Notes (Signed)
Patient asleep with audible inspiratory wheezes, difficult to arouse, required sternal rub. Patient awakened, repositioned self in bed. Denies pain. VSS on 2LO2NC. Patient denies OSA, Asthma, or any other respiratory difficulties. MD notified, will con't to monitor.

## 2011-12-18 NOTE — ED Notes (Signed)
Dr Read Drivers to bedside for evaluation. Patient coughed, cleared throat and "wheezing" cleared. Patient oriented to situation, advised patient when he becomes able to remain more alert, and able to be taken off O2 he will be discharged.

## 2011-12-18 NOTE — ED Provider Notes (Signed)
4:54 AM Patient is now awake and alert. The pain is well controlled. He states he is ready to go home. His oxygen saturation is 100% on room air.   Hanley Seamen, MD 12/18/11 [phone]

## 2012-01-06 IMAGING — CT CT ABD-PELV W/O CM
2 of 4 series · 17 of 46 positions shown, 19 images · non-contrast
Comparison: 12/01/2007

CLINICAL DATA: Abdominal pain.  History of pancreatitis.  Elevated
lipase and liver function test.

CT ABDOMEN AND PELVIS WITHOUT CONTRAST
TECHNIQUE: Multidetector CT imaging of the abdomen and pelvis was
performed following the standard protocol without intravenous
contrast.

[Series 2: abd_pel 5.0 b40f st · axial · 0.73mm/px · z∈[-446,-36]mm · 14 of 90 slices shown, 16 images]
[im 4/90  soft-tissue]
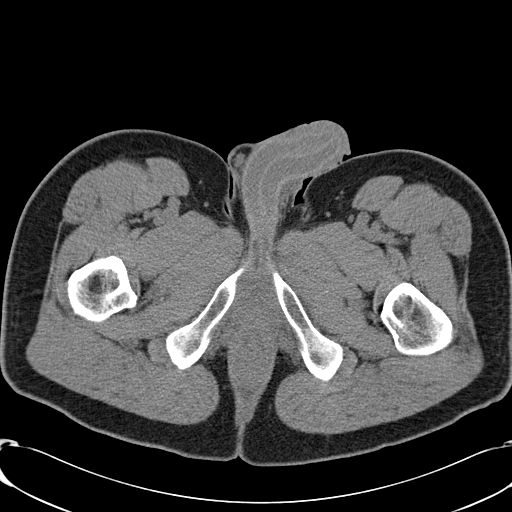
[im 4/90  bone]
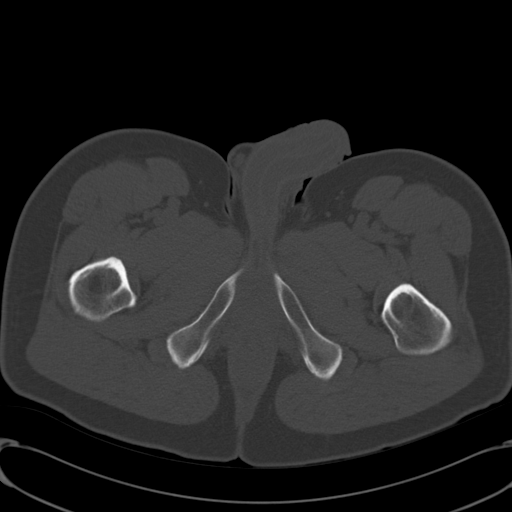
[im 10/90  soft-tissue]
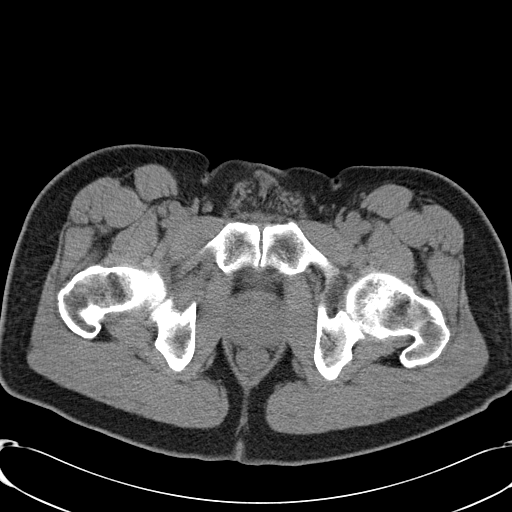
[im 16/90  soft-tissue]
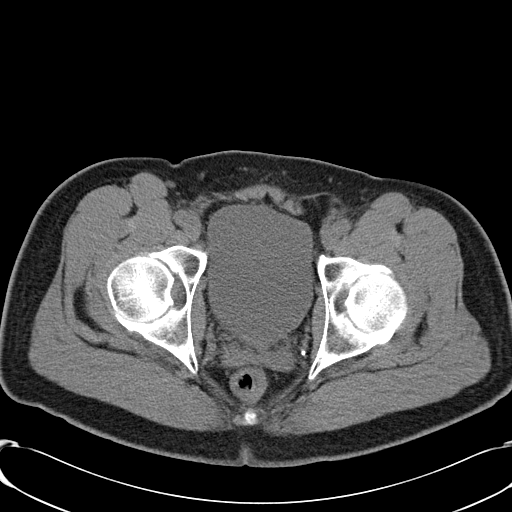
[im 23/90  soft-tissue]
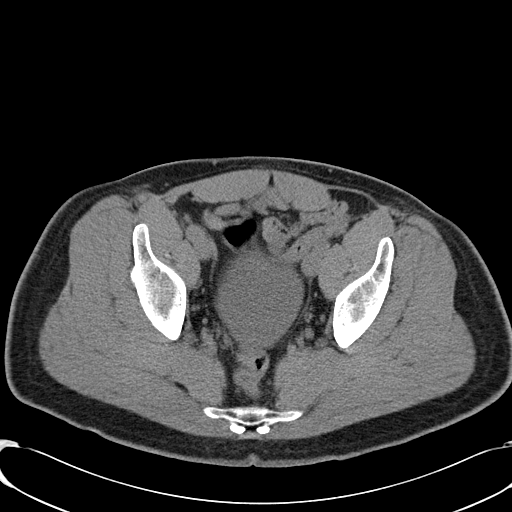
[im 29/90  soft-tissue]
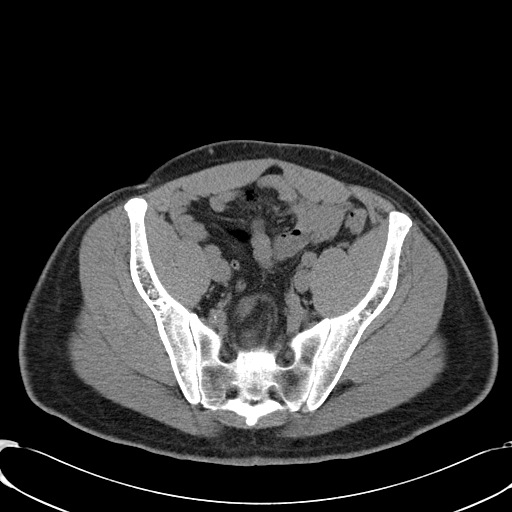
[im 35/90  soft-tissue]
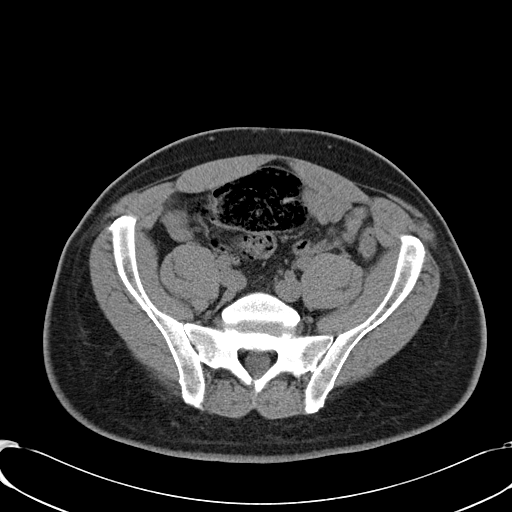
[im 42/90  soft-tissue]
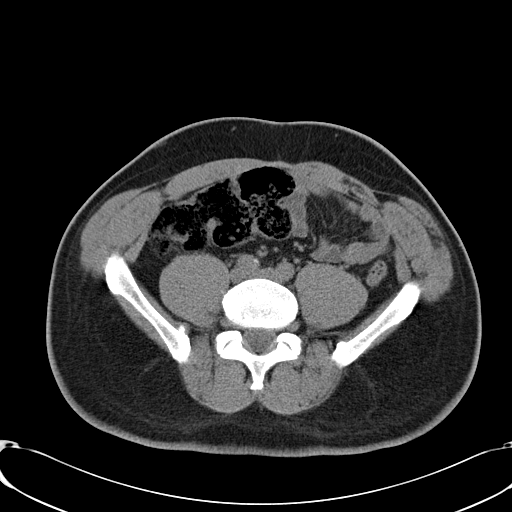
[im 48/90  soft-tissue]
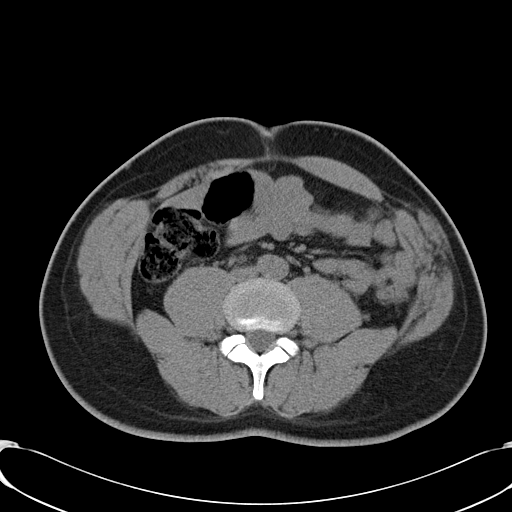
[im 55/90  soft-tissue]
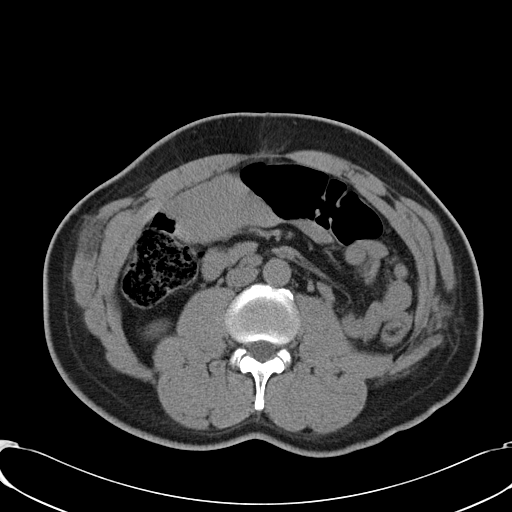
[im 55/90  bone]
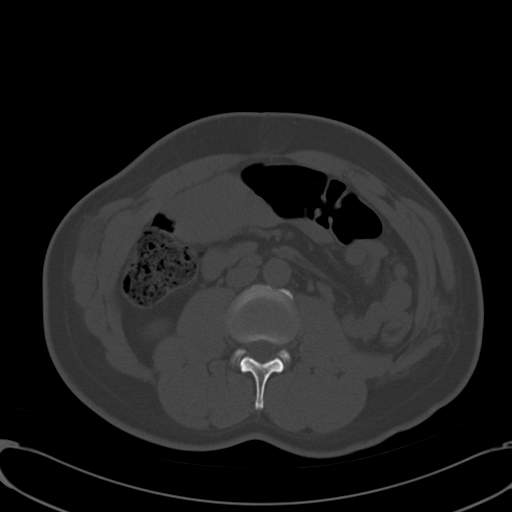
[im 61/90  soft-tissue]
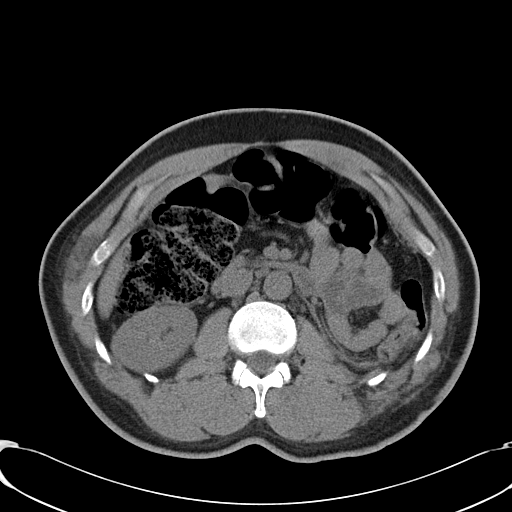
[im 67/90  soft-tissue]
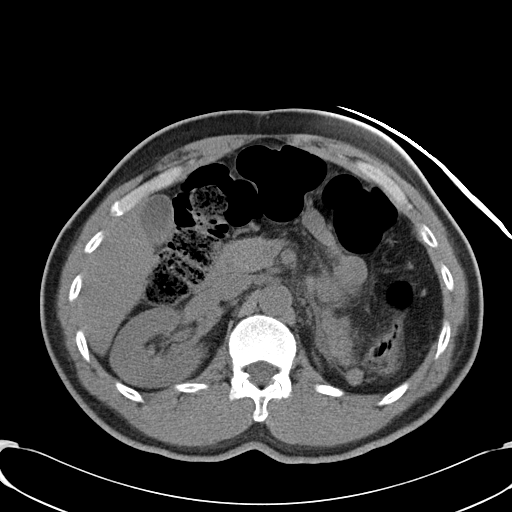
[im 74/90  soft-tissue]
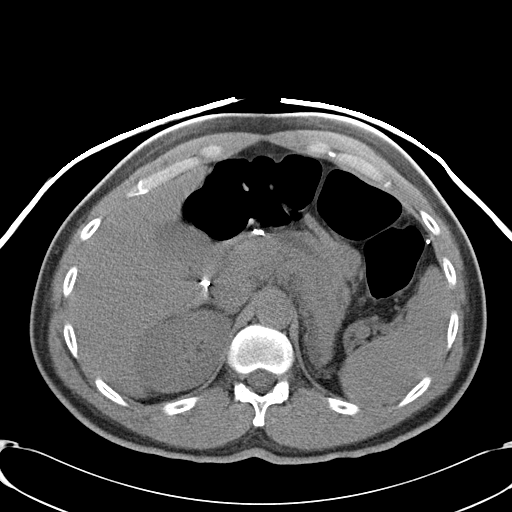
[im 80/90  soft-tissue]
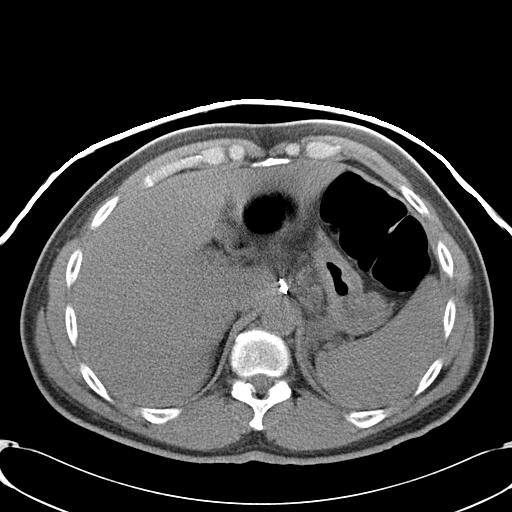
[im 86/90  soft-tissue]
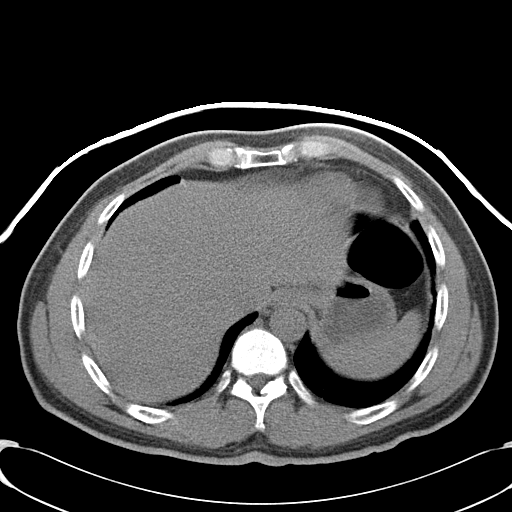

[Series 602: coronal abdomen · coronal · 0.91mm/px · 3 of 120 slices shown]
[im 40/120  soft-tissue]
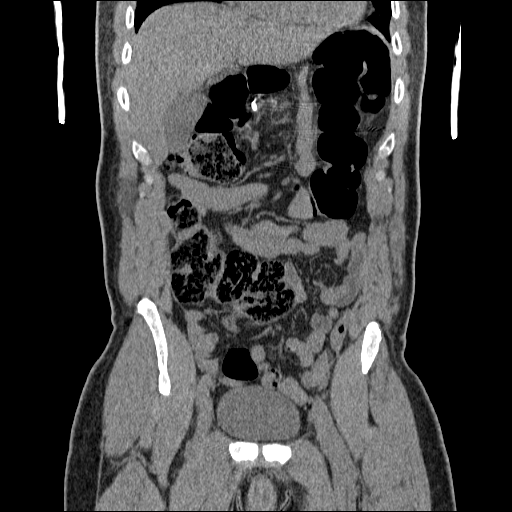
[im 53/120  soft-tissue]
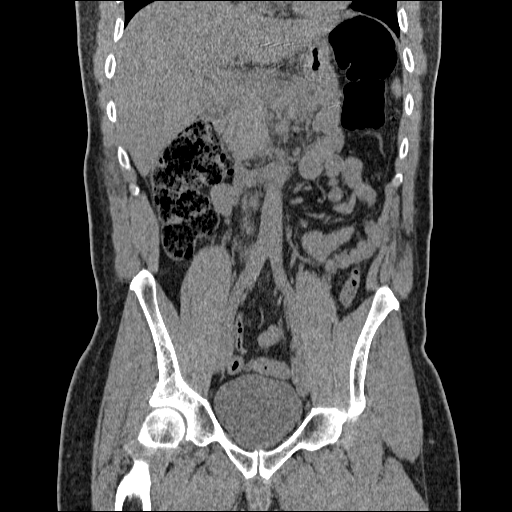
[im 67/120  soft-tissue]
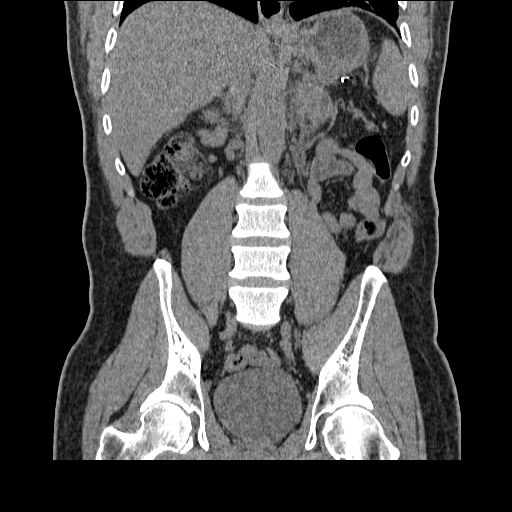

[17 of 46 positions shown; findings below may reference images not displayed]

FINDINGS: The lung bases are clear.  The unenhanced appearance of
liver, gallbladder, adrenal glands and right kidney are normal.
The patient has had a left nephrectomy.  There is nodularity in the
nephrectomy bed with a 12 mm nodule seen on series 2, image 24
which is unchanged and is compatible with a splenule.  The pancreas
is abnormal appearance with edema and stranding adjacent to the
body and tail of the pancreas.  The pancreatic duct is dilated in
this area.  There is abrupt transition point of the pancreatic duct
seen on series 2, image 17 suggesting underlying stricture.  No
dilated small bowel loops are seen.  There is a normal appendix in
the right lower quadrant.  No inflammatory changes are seen in the
colon.  The prostate urinary bladder are normal.  No aggressive
lytic or blastic bone lesions are seen.
IMPRESSION: Findings compatible with acute pancreatitis involving the body and
tail of the pancreas as detailed above.  There is underlying
dilatation of the pancreatic duct in this region with above
transition point suggesting stricture.  This pattern can be seen
with post-traumatic pancreatitis.

## 2014-02-01 ENCOUNTER — Encounter (HOSPITAL_BASED_OUTPATIENT_CLINIC_OR_DEPARTMENT_OTHER): Payer: Self-pay | Admitting: *Deleted

## 2014-02-01 ENCOUNTER — Emergency Department (HOSPITAL_BASED_OUTPATIENT_CLINIC_OR_DEPARTMENT_OTHER)
Admission: EM | Admit: 2014-02-01 | Discharge: 2014-02-02 | Disposition: A | Discharge status: Home / Self Care | Payer: 59 | Attending: Emergency Medicine | Admitting: Emergency Medicine

## 2014-02-01 DIAGNOSIS — K859 Acute pancreatitis without necrosis or infection, unspecified: Secondary | ICD-10-CM

## 2014-02-01 DIAGNOSIS — Z79899 Other long term (current) drug therapy: Secondary | ICD-10-CM | POA: Diagnosis not present

## 2014-02-01 DIAGNOSIS — Z87828 Personal history of other (healed) physical injury and trauma: Secondary | ICD-10-CM | POA: Diagnosis not present

## 2014-02-01 DIAGNOSIS — R1013 Epigastric pain: Secondary | ICD-10-CM | POA: Diagnosis present

## 2014-02-01 DIAGNOSIS — Z8619 Personal history of other infectious and parasitic diseases: Secondary | ICD-10-CM | POA: Insufficient documentation

## 2014-02-01 HISTORY — DX: Unspecified viral hepatitis C without hepatic coma: B19.20

## 2014-02-01 LAB — COMPREHENSIVE METABOLIC PANEL
ALK PHOS: 68 U/L (ref 39–117)
ALT: 91 U/L — AB (ref 0–53)
AST: 68 U/L — ABNORMAL HIGH (ref 0–37)
Albumin: 4.5 g/dL (ref 3.5–5.2)
Anion gap: <3 — ABNORMAL LOW (ref 5–15)
BUN: 15 mg/dL (ref 6–23)
CHLORIDE: 108 mmol/L (ref 96–112)
CO2: 28 mmol/L (ref 19–32)
Calcium: 8.9 mg/dL (ref 8.4–10.5)
Creatinine, Ser: 1.21 mg/dL (ref 0.50–1.35)
GFR calc Af Amer: 77 mL/min — ABNORMAL LOW (ref >90)
GFR calc non Af Amer: 67 mL/min — ABNORMAL LOW (ref >90)
Glucose, Bld: 117 mg/dL — ABNORMAL HIGH (ref 70–99)
Potassium: 4 mmol/L (ref 3.5–5.1)
Sodium: 138 mmol/L (ref 135–145)
TOTAL PROTEIN: 8.2 g/dL (ref 6.0–8.3)
Total Bilirubin: 0.5 mg/dL (ref 0.3–1.2)

## 2014-02-01 LAB — CBC WITH DIFFERENTIAL/PLATELET
BASOS ABS: 0 10*3/uL (ref 0.0–0.1)
Basophils Relative: 0 % (ref 0–1)
Eosinophils Absolute: 0.1 10*3/uL (ref 0.0–0.7)
Eosinophils Relative: 1 % (ref 0–5)
HCT: 42 % (ref 39.0–52.0)
HEMOGLOBIN: 14.1 g/dL (ref 13.0–17.0)
LYMPHS ABS: 1.7 10*3/uL (ref 0.7–4.0)
LYMPHS PCT: 27 % (ref 12–46)
MCH: 30.7 pg (ref 26.0–34.0)
MCHC: 33.6 g/dL (ref 30.0–36.0)
MCV: 91.5 fL (ref 78.0–100.0)
Monocytes Absolute: 0.4 10*3/uL (ref 0.1–1.0)
Monocytes Relative: 7 % (ref 3–12)
NEUTROS ABS: 4.1 10*3/uL (ref 1.7–7.7)
Neutrophils Relative %: 65 % (ref 43–77)
Platelets: 123 10*3/uL — ABNORMAL LOW (ref 150–400)
RBC: 4.59 MIL/uL (ref 4.22–5.81)
RDW: 12.9 % (ref 11.5–15.5)
WBC: 6.4 10*3/uL (ref 4.0–10.5)

## 2014-02-01 MED ORDER — HYDROMORPHONE HCL 1 MG/ML IJ SOLN
1.0000 mg | Freq: Once | INTRAMUSCULAR | Status: AC
  Administered 2014-02-01: 1 mg via INTRAVENOUS
  Filled 2014-02-01: qty 1

## 2014-02-01 MED ORDER — ONDANSETRON HCL 4 MG/2ML IJ SOLN
4.0000 mg | Freq: Once | INTRAMUSCULAR | Status: AC
  Administered 2014-02-01: 4 mg via INTRAVENOUS
  Filled 2014-02-01: qty 2

## 2014-02-01 MED ORDER — SODIUM CHLORIDE 0.9 % IV SOLN
INTRAVENOUS | Status: DC
  Administered 2014-02-01: 23:00:00 via INTRAVENOUS
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
## 2014-02-02 LAB — LIPASE, BLOOD: Lipase: 679 U/L — ABNORMAL HIGH (ref 11–59)

## 2014-02-02 MED ORDER — SODIUM CHLORIDE 0.9 % IV BOLUS (SEPSIS)
1000.0000 mL | Freq: Once | INTRAVENOUS | Status: AC
  Administered 2014-02-02: 1000 mL via INTRAVENOUS

## 2014-02-02 MED ORDER — HYDROMORPHONE HCL 1 MG/ML IJ SOLN
1.0000 mg | Freq: Once | INTRAMUSCULAR | Status: AC
  Administered 2014-02-02: 1 mg via INTRAVENOUS
  Filled 2014-02-02: qty 1

## 2014-02-02 MED ORDER — HYDROMORPHONE HCL 4 MG PO TABS: 4.0000 mg | ORAL_TABLET | ORAL | Status: DC | PRN

## 2014-02-02 MED ORDER — ONDANSETRON 8 MG PO TBDP: 8.0000 mg | ORAL_TABLET | Freq: Three times a day (TID) | ORAL | Status: DC | PRN

## 2014-02-02 NOTE — Discharge Instructions (Signed)

## 2014-09-22 ENCOUNTER — Encounter (HOSPITAL_BASED_OUTPATIENT_CLINIC_OR_DEPARTMENT_OTHER): Payer: Self-pay | Admitting: Emergency Medicine

## 2014-09-22 ENCOUNTER — Emergency Department (HOSPITAL_BASED_OUTPATIENT_CLINIC_OR_DEPARTMENT_OTHER)
Admission: EM | Admit: 2014-09-22 | Discharge: 2014-09-23 | Disposition: A | Discharge status: Home / Self Care | Payer: 59 | Attending: Emergency Medicine | Admitting: Emergency Medicine

## 2014-09-22 DIAGNOSIS — Z79899 Other long term (current) drug therapy: Secondary | ICD-10-CM | POA: Diagnosis not present

## 2014-09-22 DIAGNOSIS — R109 Unspecified abdominal pain: Secondary | ICD-10-CM | POA: Diagnosis present

## 2014-09-22 DIAGNOSIS — Z8619 Personal history of other infectious and parasitic diseases: Secondary | ICD-10-CM | POA: Insufficient documentation

## 2014-09-22 DIAGNOSIS — Z87828 Personal history of other (healed) physical injury and trauma: Secondary | ICD-10-CM | POA: Insufficient documentation

## 2014-09-22 DIAGNOSIS — K859 Acute pancreatitis, unspecified: Secondary | ICD-10-CM | POA: Diagnosis not present

## 2014-09-22 LAB — CBC WITH DIFFERENTIAL/PLATELET
Basophils Absolute: 0 10*3/uL (ref 0.0–0.1)
Basophils Relative: 0 %
EOS ABS: 0 10*3/uL (ref 0.0–0.7)
EOS PCT: 0 %
HCT: 44.6 % (ref 39.0–52.0)
Hemoglobin: 14.9 g/dL (ref 13.0–17.0)
LYMPHS ABS: 1.5 10*3/uL (ref 0.7–4.0)
Lymphocytes Relative: 21 %
MCH: 30.3 pg (ref 26.0–34.0)
MCHC: 33.4 g/dL (ref 30.0–36.0)
MCV: 90.7 fL (ref 78.0–100.0)
MONO ABS: 0.5 10*3/uL (ref 0.1–1.0)
MONOS PCT: 7 %
Neutro Abs: 5.1 10*3/uL (ref 1.7–7.7)
Neutrophils Relative %: 72 %
PLATELETS: 130 10*3/uL — AB (ref 150–400)
RBC: 4.92 MIL/uL (ref 4.22–5.81)
RDW: 13.4 % (ref 11.5–15.5)
WBC: 7.1 10*3/uL (ref 4.0–10.5)

## 2014-09-22 LAB — COMPREHENSIVE METABOLIC PANEL
ALK PHOS: 63 U/L (ref 38–126)
ALT: 70 U/L — AB (ref 17–63)
AST: 67 U/L — ABNORMAL HIGH (ref 15–41)
Albumin: 4.7 g/dL (ref 3.5–5.0)
Anion gap: 8 (ref 5–15)
BUN: 15 mg/dL (ref 6–20)
CALCIUM: 9.8 mg/dL (ref 8.9–10.3)
CO2: 29 mmol/L (ref 22–32)
CREATININE: 1.11 mg/dL (ref 0.61–1.24)
Chloride: 102 mmol/L (ref 101–111)
GFR calc non Af Amer: >60 mL/min (ref >60)
GLUCOSE: 100 mg/dL — AB (ref 65–99)
Potassium: 4.6 mmol/L (ref 3.5–5.1)
SODIUM: 139 mmol/L (ref 135–145)
Total Bilirubin: 1.2 mg/dL (ref 0.3–1.2)
Total Protein: 9.4 g/dL — ABNORMAL HIGH (ref 6.5–8.1)

## 2014-09-22 LAB — URINALYSIS, ROUTINE W REFLEX MICROSCOPIC
BILIRUBIN URINE: NEGATIVE
GLUCOSE, UA: NEGATIVE mg/dL
HGB URINE DIPSTICK: NEGATIVE
Ketones, ur: 15 mg/dL — AB
Leukocytes, UA: NEGATIVE
Nitrite: NEGATIVE
PH: 6.5 (ref 5.0–8.0)
Protein, ur: 100 mg/dL — AB
SPECIFIC GRAVITY, URINE: 1.03 (ref 1.005–1.030)
UROBILINOGEN UA: 2 mg/dL — AB (ref 0.0–1.0)

## 2014-09-22 LAB — URINE MICROSCOPIC-ADD ON

## 2014-09-22 LAB — LIPASE, BLOOD: Lipase: 362 U/L — ABNORMAL HIGH (ref 22–51)

## 2014-09-22 NOTE — ED Notes (Signed)
Patient states that he has a history of pancreatitis and feels like it is induced by stress. Reports that he has diffuse pain starting in the front of his stomach and moves to his bilateral flank area

## 2014-09-23 MED ORDER — SODIUM CHLORIDE 0.9 % IV BOLUS (SEPSIS)
1000.0000 mL | Freq: Once | INTRAVENOUS | Status: AC
  Administered 2014-09-23: 1000 mL via INTRAVENOUS

## 2014-09-23 MED ORDER — OXYCODONE-ACETAMINOPHEN 5-325 MG PO TABS: 1.0000 | ORAL_TABLET | ORAL | Status: DC | PRN

## 2014-09-23 MED ORDER — ONDANSETRON HCL 4 MG/2ML IJ SOLN
4.0000 mg | Freq: Once | INTRAMUSCULAR | Status: AC
Start: 2014-09-23 — End: 2014-09-23
  Administered 2014-09-23: 4 mg via INTRAVENOUS
  Filled 2014-09-23: qty 2

## 2014-09-23 MED ORDER — HYDROMORPHONE HCL 1 MG/ML IJ SOLN
1.0000 mg | INTRAMUSCULAR | Status: AC
  Administered 2014-09-23 (×2): 1 mg via INTRAVENOUS
  Filled 2014-09-23 (×2): qty 1

## 2014-09-23 MED ORDER — HYDROMORPHONE HCL 1 MG/ML IJ SOLN
1.0000 mg | Freq: Once | INTRAMUSCULAR | Status: AC
  Administered 2014-09-23: 1 mg via INTRAVENOUS
  Filled 2014-09-23: qty 1

## 2014-09-23 MED ORDER — ONDANSETRON 4 MG PO TBDP: 4.0000 mg | ORAL_TABLET | Freq: Three times a day (TID) | ORAL | Status: DC | PRN

## 2014-09-23 MED ORDER — DOCUSATE SODIUM 100 MG PO CAPS: 100.0000 mg | ORAL_CAPSULE | Freq: Two times a day (BID) | ORAL | Status: DC

## 2014-09-23 NOTE — Discharge Instructions (Signed)
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
## 2015-01-15 ENCOUNTER — Other Ambulatory Visit (HOSPITAL_COMMUNITY): Payer: Self-pay | Admitting: Gastroenterology

## 2015-01-15 DIAGNOSIS — B1921 Unspecified viral hepatitis C with hepatic coma: Secondary | ICD-10-CM

## 2015-02-19 ENCOUNTER — Ambulatory Visit (HOSPITAL_COMMUNITY)
Admission: RE | Admit: 2015-02-19 | Discharge: 2015-02-19 | Disposition: A | Discharge status: Home / Self Care | Payer: 59 | Source: Ambulatory Visit | Attending: Gastroenterology | Admitting: Gastroenterology

## 2015-02-19 DIAGNOSIS — Z905 Acquired absence of kidney: Secondary | ICD-10-CM | POA: Insufficient documentation

## 2015-02-19 DIAGNOSIS — B1921 Unspecified viral hepatitis C with hepatic coma: Secondary | ICD-10-CM | POA: Insufficient documentation

## 2015-03-02 ENCOUNTER — Encounter (HOSPITAL_BASED_OUTPATIENT_CLINIC_OR_DEPARTMENT_OTHER): Payer: Self-pay | Admitting: *Deleted

## 2015-03-02 ENCOUNTER — Emergency Department (HOSPITAL_BASED_OUTPATIENT_CLINIC_OR_DEPARTMENT_OTHER)
Admission: EM | Admit: 2015-03-02 | Discharge: 2015-03-02 | Disposition: A | Discharge status: Home / Self Care | Payer: 59 | Attending: Emergency Medicine | Admitting: Emergency Medicine

## 2015-03-02 DIAGNOSIS — Z87828 Personal history of other (healed) physical injury and trauma: Secondary | ICD-10-CM | POA: Insufficient documentation

## 2015-03-02 DIAGNOSIS — R1013 Epigastric pain: Secondary | ICD-10-CM | POA: Diagnosis present

## 2015-03-02 DIAGNOSIS — Z8619 Personal history of other infectious and parasitic diseases: Secondary | ICD-10-CM | POA: Insufficient documentation

## 2015-03-02 DIAGNOSIS — K859 Acute pancreatitis without necrosis or infection, unspecified: Secondary | ICD-10-CM | POA: Diagnosis not present

## 2015-03-02 LAB — CBC WITH DIFFERENTIAL/PLATELET
Basophils Absolute: 0 10*3/uL (ref 0.0–0.1)
Basophils Relative: 0 %
EOS PCT: 1 %
Eosinophils Absolute: 0.1 10*3/uL (ref 0.0–0.7)
HCT: 42.8 % (ref 39.0–52.0)
Hemoglobin: 14.7 g/dL (ref 13.0–17.0)
LYMPHS ABS: 1.4 10*3/uL (ref 0.7–4.0)
LYMPHS PCT: 26 %
MCH: 31.1 pg (ref 26.0–34.0)
MCHC: 34.3 g/dL (ref 30.0–36.0)
MCV: 90.5 fL (ref 78.0–100.0)
MONOS PCT: 8 %
Monocytes Absolute: 0.4 10*3/uL (ref 0.1–1.0)
NEUTROS ABS: 3.5 10*3/uL (ref 1.7–7.7)
Neutrophils Relative %: 65 %
PLATELETS: 115 10*3/uL — AB (ref 150–400)
RBC: 4.73 MIL/uL (ref 4.22–5.81)
RDW: 13.2 % (ref 11.5–15.5)
SMEAR REVIEW: DECREASED
WBC: 5.4 10*3/uL (ref 4.0–10.5)

## 2015-03-02 LAB — COMPREHENSIVE METABOLIC PANEL
ALK PHOS: 56 U/L (ref 38–126)
ALT: 91 U/L — ABNORMAL HIGH (ref 17–63)
ANION GAP: 9 (ref 5–15)
AST: 68 U/L — ABNORMAL HIGH (ref 15–41)
Albumin: 4.3 g/dL (ref 3.5–5.0)
BILIRUBIN TOTAL: 0.9 mg/dL (ref 0.3–1.2)
BUN: 16 mg/dL (ref 6–20)
CALCIUM: 9.2 mg/dL (ref 8.9–10.3)
CO2: 25 mmol/L (ref 22–32)
Chloride: 103 mmol/L (ref 101–111)
Creatinine, Ser: 1.13 mg/dL (ref 0.61–1.24)
Glucose, Bld: 106 mg/dL — ABNORMAL HIGH (ref 65–99)
POTASSIUM: 4.2 mmol/L (ref 3.5–5.1)
SODIUM: 137 mmol/L (ref 135–145)
TOTAL PROTEIN: 8.7 g/dL — AB (ref 6.5–8.1)

## 2015-03-02 LAB — LIPASE, BLOOD: LIPASE: 485 U/L — AB (ref 11–51)

## 2015-03-02 MED ORDER — SODIUM CHLORIDE 0.9 % IV BOLUS (SEPSIS)
1000.0000 mL | Freq: Once | INTRAVENOUS | Status: AC
  Administered 2015-03-02: 1000 mL via INTRAVENOUS

## 2015-03-02 MED ORDER — ONDANSETRON 8 MG PO TBDP: 8.0000 mg | ORAL_TABLET | Freq: Three times a day (TID) | ORAL | Status: DC | PRN

## 2015-03-02 MED ORDER — KETOROLAC TROMETHAMINE 30 MG/ML IJ SOLN
30.0000 mg | Freq: Once | INTRAMUSCULAR | Status: AC
  Administered 2015-03-02: 30 mg via INTRAVENOUS
  Filled 2015-03-02: qty 1

## 2015-03-02 MED ORDER — OXYCODONE-ACETAMINOPHEN 5-325 MG PO TABS: 1.0000 | ORAL_TABLET | ORAL | Status: DC | PRN

## 2015-03-02 MED ORDER — DOCUSATE SODIUM 100 MG PO CAPS: 100.0000 mg | ORAL_CAPSULE | Freq: Two times a day (BID) | ORAL | Status: DC

## 2015-03-02 MED ORDER — HYDROMORPHONE HCL 1 MG/ML IJ SOLN
1.0000 mg | Freq: Once | INTRAMUSCULAR | Status: AC
  Administered 2015-03-02: 1 mg via INTRAVENOUS
  Filled 2015-03-02: qty 1

## 2015-03-02 MED ORDER — HYDROMORPHONE HCL 1 MG/ML IJ SOLN
2.0000 mg | Freq: Once | INTRAMUSCULAR | Status: AC
  Administered 2015-03-02: 1 mg via INTRAVENOUS
  Filled 2015-03-02: qty 2

## 2015-03-02 MED ORDER — ONDANSETRON HCL 4 MG/2ML IJ SOLN
4.0000 mg | Freq: Once | INTRAMUSCULAR | Status: AC
Start: 2015-03-02 — End: 2015-03-02
  Administered 2015-03-02: 4 mg via INTRAVENOUS
  Filled 2015-03-02: qty 2

## 2015-03-02 MED FILL — OXYCODONE/APAP 5-325: 5-325 | 2 days supply | Qty: 15 | Fill #0

## 2015-03-02 MED FILL — DOK 100 MG SOFTGEL: 100 | 50 days supply | Qty: 100 | Fill #0
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
## 2015-03-02 NOTE — ED Notes (Signed)
Patient states he has had abdominal pain and nausea for the last 24 hours.  History of pancreatitis.

## 2015-03-02 NOTE — Discharge Instructions (Signed)

## 2015-03-04 ENCOUNTER — Emergency Department (HOSPITAL_BASED_OUTPATIENT_CLINIC_OR_DEPARTMENT_OTHER): Payer: 59

## 2015-03-04 ENCOUNTER — Emergency Department (HOSPITAL_BASED_OUTPATIENT_CLINIC_OR_DEPARTMENT_OTHER)
Admission: EM | Admit: 2015-03-04 | Discharge: 2015-03-04 | Disposition: A | Discharge status: Home / Self Care | Payer: 59 | Attending: Emergency Medicine | Admitting: Emergency Medicine

## 2015-03-04 ENCOUNTER — Encounter (HOSPITAL_BASED_OUTPATIENT_CLINIC_OR_DEPARTMENT_OTHER): Payer: Self-pay | Admitting: Emergency Medicine

## 2015-03-04 DIAGNOSIS — K859 Acute pancreatitis without necrosis or infection, unspecified: Secondary | ICD-10-CM

## 2015-03-04 DIAGNOSIS — R1010 Upper abdominal pain, unspecified: Secondary | ICD-10-CM | POA: Diagnosis present

## 2015-03-04 DIAGNOSIS — Z8619 Personal history of other infectious and parasitic diseases: Secondary | ICD-10-CM | POA: Diagnosis not present

## 2015-03-04 DIAGNOSIS — R509 Fever, unspecified: Secondary | ICD-10-CM

## 2015-03-04 DIAGNOSIS — Z87828 Personal history of other (healed) physical injury and trauma: Secondary | ICD-10-CM | POA: Insufficient documentation

## 2015-03-04 DIAGNOSIS — K858 Other acute pancreatitis without necrosis or infection: Secondary | ICD-10-CM

## 2015-03-04 DIAGNOSIS — K5909 Other constipation: Secondary | ICD-10-CM

## 2015-03-04 DIAGNOSIS — R000 Tachycardia, unspecified: Secondary | ICD-10-CM | POA: Insufficient documentation

## 2015-03-04 LAB — CBC WITH DIFFERENTIAL/PLATELET
Basophils Absolute: 0 10*3/uL (ref 0.0–0.1)
Basophils Relative: 0 %
EOS ABS: 0 10*3/uL (ref 0.0–0.7)
EOS PCT: 0 %
HEMATOCRIT: 39.5 % (ref 39.0–52.0)
Hemoglobin: 13.6 g/dL (ref 13.0–17.0)
LYMPHS ABS: 0.8 10*3/uL (ref 0.7–4.0)
Lymphocytes Relative: 11 %
MCH: 31.4 pg (ref 26.0–34.0)
MCHC: 34.4 g/dL (ref 30.0–36.0)
MCV: 91.2 fL (ref 78.0–100.0)
MONO ABS: 0.6 10*3/uL (ref 0.1–1.0)
MONOS PCT: 8 %
Neutro Abs: 6.1 10*3/uL (ref 1.7–7.7)
Neutrophils Relative %: 81 %
Platelets: 114 10*3/uL — ABNORMAL LOW (ref 150–400)
RBC: 4.33 MIL/uL (ref 4.22–5.81)
RDW: 12.7 % (ref 11.5–15.5)
WBC: 7.5 10*3/uL (ref 4.0–10.5)

## 2015-03-04 LAB — COMPREHENSIVE METABOLIC PANEL
ALT: 66 U/L — ABNORMAL HIGH (ref 17–63)
ANION GAP: 9 (ref 5–15)
AST: 53 U/L — ABNORMAL HIGH (ref 15–41)
Albumin: 4.1 g/dL (ref 3.5–5.0)
Alkaline Phosphatase: 48 U/L (ref 38–126)
BILIRUBIN TOTAL: 1.6 mg/dL — AB (ref 0.3–1.2)
BUN: 16 mg/dL (ref 6–20)
CALCIUM: 8.7 mg/dL — AB (ref 8.9–10.3)
CO2: 24 mmol/L (ref 22–32)
Chloride: 103 mmol/L (ref 101–111)
Creatinine, Ser: 1.41 mg/dL — ABNORMAL HIGH (ref 0.61–1.24)
GFR, EST NON AFRICAN AMERICAN: 55 mL/min — AB (ref >60)
Glucose, Bld: 109 mg/dL — ABNORMAL HIGH (ref 65–99)
POTASSIUM: 4 mmol/L (ref 3.5–5.1)
Sodium: 136 mmol/L (ref 135–145)
TOTAL PROTEIN: 8.4 g/dL — AB (ref 6.5–8.1)

## 2015-03-04 LAB — LIPASE, BLOOD: LIPASE: 295 U/L — AB (ref 11–51)

## 2015-03-04 LAB — URINALYSIS, ROUTINE W REFLEX MICROSCOPIC
BILIRUBIN URINE: NEGATIVE
GLUCOSE, UA: NEGATIVE mg/dL
Hgb urine dipstick: NEGATIVE
KETONES UR: 15 mg/dL — AB
LEUKOCYTES UA: NEGATIVE
NITRITE: NEGATIVE
PROTEIN: NEGATIVE mg/dL
Specific Gravity, Urine: 1.018 (ref 1.005–1.030)
pH: 6 (ref 5.0–8.0)

## 2015-03-04 LAB — PROTIME-INR
INR: 1.13 (ref 0.00–1.49)
Prothrombin Time: 14.7 seconds (ref 11.6–15.2)

## 2015-03-04 MED ORDER — ACETAMINOPHEN 325 MG PO TABS
ORAL_TABLET | ORAL | Status: AC
  Filled 2015-03-04: qty 2

## 2015-03-04 MED ORDER — HYDROMORPHONE HCL 1 MG/ML IJ SOLN
2.0000 mg | Freq: Once | INTRAMUSCULAR | Status: AC
  Administered 2015-03-04: 2 mg via INTRAVENOUS
  Filled 2015-03-04: qty 2

## 2015-03-04 MED ORDER — SODIUM CHLORIDE 0.9 % IV BOLUS (SEPSIS)
1000.0000 mL | Freq: Once | INTRAVENOUS | Status: AC
  Administered 2015-03-04: 1000 mL via INTRAVENOUS

## 2015-03-04 MED ORDER — HYDROMORPHONE HCL 2 MG PO TABS: 2.0000 mg | ORAL_TABLET | Freq: Four times a day (QID) | ORAL | Status: DC | PRN

## 2015-03-04 MED ORDER — HYDROMORPHONE HCL 1 MG/ML IJ SOLN: 1.0000 mg | Freq: Once | INTRAMUSCULAR | Status: DC

## 2015-03-04 MED ORDER — ONDANSETRON HCL 4 MG/2ML IJ SOLN
4.0000 mg | Freq: Once | INTRAMUSCULAR | Status: AC
Start: 2015-03-04 — End: 2015-03-04
  Administered 2015-03-04: 4 mg via INTRAVENOUS
  Filled 2015-03-04: qty 2

## 2015-03-04 MED ORDER — ACETAMINOPHEN 325 MG PO TABS
650.0000 mg | ORAL_TABLET | Freq: Once | ORAL | Status: AC | PRN
  Administered 2015-03-04: 650 mg via ORAL
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
## 2015-03-04 NOTE — Discharge Instructions (Signed)
I recommend you stay on a clear, liquid diet for the next 4-5 days and then slowly transition herself. I recommend you increase your fluid intake and rest. Please do not take Percocet anymore as it has Tylenol in it and we will change it to a stronger prescription of Dilaudid. I recommend that you avoid ibuprofen if you did have a slightly elevated kidney function level at 1.4 which is only mildly elevated from your baseline. If you're having chills because of your fever I think it is safe to take a small dose of Tylenol, 650 mg, once or twice a day for fever but only do this for the next 2-3 days. Otherwise I would normally recommend that you avoid Tylenol altogether because of your history of hepatitis C.     For your constipation, I recommend you increase her fluid and fiber intake. If you're not able to eat foods that are high in fiber you may use Benefiber or Metamucil over-the-counter. I also recommend that you use MiraLAX 1 scoop in a large glass of water twice a day and Colace 100 mg tablets twice a day to help with constipation. If this is not enough to keep your bowels moving you may use over-the-counter magnesium citrate, Fleet enemas, glycerin suppositories as needed.   Constipation, Adult Constipation is when a person has fewer than three bowel movements a week, has difficulty having a bowel movement, or has stools that are dry, hard, or larger than normal. As people grow older, constipation is more common. A low-fiber diet, not taking in enough fluids, and taking certain medicines may make constipation worse.  CAUSES   Certain medicines, such as antidepressants, pain medicine, iron supplements, antacids, and water pills.   Certain diseases, such as diabetes, irritable bowel syndrome (IBS), thyroid disease, or depression.   Not drinking enough water.   Not eating enough fiber-rich foods.   Stress or travel.   Lack of physical activity or exercise.   Ignoring the urge to  have a bowel movement.   Using laxatives too much.  SIGNS AND SYMPTOMS   Having fewer than three bowel movements a week.   Straining to have a bowel movement.   Having stools that are hard, dry, or larger than normal.   Feeling full or bloated.   Pain in the lower abdomen.   Not feeling relief after having a bowel movement.  DIAGNOSIS  Your health care provider will take a medical history and perform a physical exam. Further testing may be done for severe constipation. Some tests may include:  A barium enema X-ray to examine your rectum, colon, and, sometimes, your small intestine.   A sigmoidoscopy to examine your lower colon.   A colonoscopy to examine your entire colon. TREATMENT  Treatment will depend on the severity of your constipation and what is causing it. Some dietary treatments include drinking more fluids and eating more fiber-rich foods. Lifestyle treatments may include regular exercise. If these diet and lifestyle recommendations do not help, your health care provider may recommend taking over-the-counter laxative medicines to help you have bowel movements. Prescription medicines may be prescribed if over-the-counter medicines do not work.  HOME CARE INSTRUCTIONS   Eat foods that have a lot of fiber, such as fruits, vegetables, whole grains, and beans.  Limit foods high in fat and processed sugars, such as french fries, hamburgers, cookies, candies, and soda.   A fiber supplement may be added to your diet if you cannot get enough fiber from foods.  Drink enough fluids to keep your urine clear or pale yellow.   Exercise regularly or as directed by your health care provider.   Go to the restroom when you have the urge to go. Do not hold it.   Only take over-the-counter or prescription medicines as directed by your health care provider. Do not take other medicines for constipation without talking to your health care provider first.  SEEK IMMEDIATE  MEDICAL CARE IF:   You have bright red blood in your stool.   Your constipation lasts for more than 4 days or gets worse.   You have abdominal or rectal pain.   You have thin, pencil-like stools.   You have unexplained weight loss. MAKE SURE YOU:   Understand these instructions.  Will watch your condition.  Will get help right away if you are not doing well or get worse.   This information is not intended to replace advice given to you by your health care provider. Make sure you discuss any questions you have with your health care provider.   Document Released: 09/18/2003 Document Revised: 01/10/2014 Document Reviewed: 10/01/2012 Elsevier Interactive Patient Education 2016 Elsevier Inc.  High-Fiber Diet Fiber, also called dietary fiber, is a type of carbohydrate found in fruits, vegetables, whole grains, and beans. A high-fiber diet can have many health benefits. Your health care provider may recommend a high-fiber diet to help:  Prevent constipation. Fiber can make your bowel movements more regular.  Lower your cholesterol.  Relieve hemorrhoids, uncomplicated diverticulosis, or irritable bowel syndrome.  Prevent overeating as part of a weight-loss plan.  Prevent heart disease, type 2 diabetes, and certain cancers. WHAT IS MY PLAN? The recommended daily intake of fiber includes:  38 grams for men under age 12.  30 grams for men over age 71.  25 grams for women under age 53.  21 grams for women over age 48. You can get the recommended daily intake of dietary fiber by eating a variety of fruits, vegetables, grains, and beans. Your health care provider may also recommend a fiber supplement if it is not possible to get enough fiber through your diet. WHAT DO I NEED TO KNOW ABOUT A HIGH-FIBER DIET?  Fiber supplements have not been widely studied for their effectiveness, so it is better to get fiber through food sources.  Always check the fiber content on  thenutrition facts label of any prepackaged food. Look for foods that contain at least 5 grams of fiber per serving.  Ask your dietitian if you have questions about specific foods that are related to your condition, especially if those foods are not listed in the following section.  Increase your daily fiber consumption gradually. Increasing your intake of dietary fiber too quickly may cause bloating, cramping, or gas.  Drink plenty of water. Water helps you to digest fiber. WHAT FOODS CAN I EAT? Grains Whole-grain breads. Multigrain cereal. Oats and oatmeal. Brown rice. Barley. Bulgur wheat. Millet. Bran muffins. Popcorn. Rye wafer crackers. Vegetables Sweet potatoes. Spinach. Kale. Artichokes. Cabbage. Broccoli. Green peas. Carrots. Squash. Fruits Berries. Pears. Apples. Oranges. Avocados. Prunes and raisins. Dried figs. Meats and Other Protein Sources Navy, kidney, pinto, and soy beans. Split peas. Lentils. Nuts and seeds. Dairy Fiber-fortified yogurt. Beverages Fiber-fortified soy milk. Fiber-fortified orange juice. Other Fiber bars. The items listed above may not be a complete list of recommended foods or beverages. Contact your dietitian for more options. WHAT FOODS ARE NOT RECOMMENDED? Grains White bread. Pasta made with refined flour. White rice.  Vegetables Fried potatoes. Canned vegetables. Well-cooked vegetables.  Fruits Fruit juice. Cooked, strained fruit. Meats and Other Protein Sources Fatty cuts of meat. Fried Environmental education officer or fried fish. Dairy Milk. Yogurt. Cream cheese. Sour cream. Beverages Soft drinks. Other Cakes and pastries. Butter and oils. The items listed above may not be a complete list of foods and beverages to avoid. Contact your dietitian for more information. WHAT ARE SOME TIPS FOR INCLUDING HIGH-FIBER FOODS IN MY DIET?  Eat a wide variety of high-fiber foods.  Make sure that half of all grains consumed each day are whole grains.  Replace breads  and cereals made from refined flour or white flour with whole-grain breads and cereals.  Replace white rice with brown rice, bulgur wheat, or millet.  Start the day with a breakfast that is high in fiber, such as a cereal that contains at least 5 grams of fiber per serving.  Use beans in place of meat in soups, salads, or pasta.  Eat high-fiber snacks, such as berries, raw vegetables, nuts, or popcorn.   This information is not intended to replace advice given to you by your health care provider. Make sure you discuss any questions you have with your health care provider.   Document Released: 12/20/2004 Document Revised: 01/10/2014 Document Reviewed: 06/04/2013 Elsevier Interactive Patient Education 2016 Elsevier Inc.  Fever, Adult A fever is an increase in the body's temperature. It is usually defined as a temperature of 100F (38C) or higher. Brief mild or moderate fevers generally have no long-term effects, and they often do not require treatment. Moderate or high fevers may make you feel uncomfortable and can sometimes be a sign of a serious illness or disease. The sweating that may occur with repeated or prolonged fever may also cause dehydration. Fever is confirmed by taking a temperature with a thermometer. A measured temperature can vary with:  Age.  Time of day.  Location of the thermometer:  Mouth (oral).  Rectum (rectal).  Ear (tympanic).  Underarm (axillary).  Forehead (temporal). HOME CARE INSTRUCTIONS Pay attention to any changes in your symptoms. Take these actions to help with your condition:  Take over-the counter and prescription medicines only as told by your health care provider. Follow the dosing instructions carefully.  If you were prescribed an antibiotic medicine, take it as told by your health care provider. Do not stop taking the antibiotic even if you start to feel better.  Rest as needed.  Drink enough fluid to keep your urine clear or pale  yellow. This helps to prevent dehydration.  Sponge yourself or bathe with room-temperature water to help reduce your body temperature as needed. Do not use ice water.  Do not overbundle yourself in blankets or heavy clothes. SEEK MEDICAL CARE IF:  You vomit.  You cannot eat or drink without vomiting.  You have diarrhea.  You have pain when you urinate.  Your symptoms do not improve with treatment.  You develop new symptoms.  You develop excessive weakness. SEEK IMMEDIATE MEDICAL CARE IF:  You have shortness of breath or have trouble breathing.  You are dizzy or you faint.  You are disoriented or confused.  You develop signs of dehydration, such as a dry mouth, decreased urination, or paleness.  You develop severe pain in your abdomen.  You have persistent vomiting or diarrhea.  You develop a skin rash.  Your symptoms suddenly get worse.   This information is not intended to replace advice given to you by your health care provider.  Make sure you discuss any questions you have with your health care provider.   Document Released: 06/15/2000 Document Revised: 09/10/2014 Document Reviewed: 02/13/2014 Elsevier Interactive Patient Education 2016 Elsevier Inc.   Acute Pancreatitis Acute pancreatitis is a disease in which the pancreas becomes suddenly inflamed. The pancreas is a large gland located behind your stomach. The pancreas produces enzymes that help digest food. The pancreas also releases the hormones glucagon and insulin that help regulate blood sugar. Damage to the pancreas occurs when the digestive enzymes from the pancreas are activated and begin attacking the pancreas before being released into the intestine. Most acute attacks last a couple of days and can cause serious complications. Some people become dehydrated and develop low blood pressure. In severe cases, bleeding into the pancreas can lead to shock and can be life-threatening. The lungs, heart, and kidneys  may fail. CAUSES  Pancreatitis can happen to anyone. In some cases, the cause is unknown. Most cases are caused by:  Alcohol abuse.  Gallstones. Other less common causes are:  Certain medicines.  Exposure to certain chemicals.  Infection.  Damage caused by an accident (trauma).  Abdominal surgery. SYMPTOMS   Pain in the upper abdomen that may radiate to the back.  Tenderness and swelling of the abdomen.  Nausea and vomiting. DIAGNOSIS  Your caregiver will perform a physical exam. Blood and stool tests may be done to confirm the diagnosis. Imaging tests may also be done, such as X-rays, CT scans, or an ultrasound of the abdomen. TREATMENT  Treatment usually requires a stay in the hospital. Treatment may include:  Pain medicine.  Fluid replacement through an intravenous line (IV).  Placing a tube in the stomach to remove stomach contents and control vomiting.  Not eating for 3 or 4 days. This gives your pancreas a rest, because enzymes are not being produced that can cause further damage.  Antibiotic medicines if your condition is caused by an infection.  Surgery of the pancreas or gallbladder. HOME CARE INSTRUCTIONS   Follow the diet advised by your caregiver. This may involve avoiding alcohol and decreasing the amount of fat in your diet.  Eat smaller, more frequent meals. This reduces the amount of digestive juices the pancreas produces.  Drink enough fluids to keep your urine clear or pale yellow.  Only take over-the-counter or prescription medicines as directed by your caregiver.  Avoid drinking alcohol if it caused your condition.  Do not smoke.  Get plenty of rest.  Check your blood sugar at home as directed by your caregiver.  Keep all follow-up appointments as directed by your caregiver. SEEK MEDICAL CARE IF:   You do not recover as quickly as expected.  You develop new or worsening symptoms.  You have persistent pain, weakness, or  nausea.  You recover and then have another episode of pain. SEEK IMMEDIATE MEDICAL CARE IF:   You are unable to eat or keep fluids down.  Your pain becomes severe.  You have a fever or persistent symptoms for more than 2 to 3 days.  You have a fever and your symptoms suddenly get worse.  Your skin or the white part of your eyes turn yellow (jaundice).  You develop vomiting.  You feel dizzy, or you faint.  Your blood sugar is high (over 300 mg/dL). MAKE SURE YOU:   Understand these instructions.  Will watch your condition.  Will get help right away if you are not doing well or get worse.   This information is  not intended to replace advice given to you by your health care provider. Make sure you discuss any questions you have with your health care provider.   Document Released: 12/20/2004 Document Revised: 06/21/2011 Document Reviewed: 03/31/2011 Elsevier Interactive Patient Education [employer]

## 2015-03-04 NOTE — ED Notes (Signed)
Pt was seen Monday for pancreatitis. Pt states started having chills last night and now having increased abd pain.

## 2015-03-04 NOTE — ED Notes (Signed)
C/o upper abd pain w fever and chills

## 2015-04-22 ENCOUNTER — Encounter (HOSPITAL_BASED_OUTPATIENT_CLINIC_OR_DEPARTMENT_OTHER): Payer: Self-pay

## 2015-04-22 ENCOUNTER — Emergency Department (HOSPITAL_BASED_OUTPATIENT_CLINIC_OR_DEPARTMENT_OTHER)
Admission: EM | Admit: 2015-04-22 | Discharge: 2015-04-23 | Disposition: A | Discharge status: Home / Self Care | Payer: 59 | Attending: Emergency Medicine | Admitting: Emergency Medicine

## 2015-04-22 DIAGNOSIS — M549 Dorsalgia, unspecified: Secondary | ICD-10-CM | POA: Insufficient documentation

## 2015-04-22 DIAGNOSIS — Z8619 Personal history of other infectious and parasitic diseases: Secondary | ICD-10-CM | POA: Insufficient documentation

## 2015-04-22 DIAGNOSIS — K861 Other chronic pancreatitis: Secondary | ICD-10-CM | POA: Diagnosis not present

## 2015-04-22 DIAGNOSIS — R51 Headache: Secondary | ICD-10-CM | POA: Diagnosis not present

## 2015-04-22 DIAGNOSIS — R1013 Epigastric pain: Secondary | ICD-10-CM | POA: Diagnosis present

## 2015-04-22 DIAGNOSIS — Z87828 Personal history of other (healed) physical injury and trauma: Secondary | ICD-10-CM | POA: Insufficient documentation

## 2015-04-22 LAB — CBC WITH DIFFERENTIAL/PLATELET
BASOS ABS: 0 10*3/uL (ref 0.0–0.1)
Basophils Relative: 0 %
EOS ABS: 0.1 10*3/uL (ref 0.0–0.7)
EOS PCT: 2 %
HCT: 40 % (ref 39.0–52.0)
HEMOGLOBIN: 13.8 g/dL (ref 13.0–17.0)
LYMPHS PCT: 44 %
Lymphs Abs: 2.9 10*3/uL (ref 0.7–4.0)
MCH: 31.2 pg (ref 26.0–34.0)
MCHC: 34.5 g/dL (ref 30.0–36.0)
MCV: 90.3 fL (ref 78.0–100.0)
Monocytes Absolute: 0.5 10*3/uL (ref 0.1–1.0)
Monocytes Relative: 7 %
NEUTROS PCT: 47 %
Neutro Abs: 3.1 10*3/uL (ref 1.7–7.7)
PLATELETS: 159 10*3/uL (ref 150–400)
RBC: 4.43 MIL/uL (ref 4.22–5.81)
RDW: 12.8 % (ref 11.5–15.5)
WBC: 6.6 10*3/uL (ref 4.0–10.5)

## 2015-04-22 LAB — COMPREHENSIVE METABOLIC PANEL
ALT: 20 U/L (ref 17–63)
ANION GAP: 10 (ref 5–15)
AST: 26 U/L (ref 15–41)
Albumin: 4.4 g/dL (ref 3.5–5.0)
Alkaline Phosphatase: 51 U/L (ref 38–126)
BUN: 16 mg/dL (ref 6–20)
CHLORIDE: 105 mmol/L (ref 101–111)
CO2: 24 mmol/L (ref 22–32)
CREATININE: 1.16 mg/dL (ref 0.61–1.24)
Calcium: 9.4 mg/dL (ref 8.9–10.3)
Glucose, Bld: 81 mg/dL (ref 65–99)
Potassium: 4.2 mmol/L (ref 3.5–5.1)
SODIUM: 139 mmol/L (ref 135–145)
Total Bilirubin: 0.8 mg/dL (ref 0.3–1.2)
Total Protein: 8.8 g/dL — ABNORMAL HIGH (ref 6.5–8.1)

## 2015-04-22 LAB — LIPASE, BLOOD: LIPASE: 93 U/L — AB (ref 11–51)

## 2015-04-22 MED ORDER — SODIUM CHLORIDE 0.9 % IV SOLN: INTRAVENOUS | Status: DC

## 2015-04-22 MED ORDER — HYDROMORPHONE HCL 1 MG/ML IJ SOLN
1.0000 mg | Freq: Once | INTRAMUSCULAR | Status: AC
  Administered 2015-04-22: 1 mg via INTRAVENOUS
  Filled 2015-04-22: qty 1

## 2015-04-22 MED ORDER — SODIUM CHLORIDE 0.9 % IV BOLUS (SEPSIS)
250.0000 mL | Freq: Once | INTRAVENOUS | Status: AC
  Administered 2015-04-22: 250 mL via INTRAVENOUS

## 2015-04-22 MED ORDER — HYDROMORPHONE HCL 4 MG PO TABS: 4.0000 mg | ORAL_TABLET | Freq: Four times a day (QID) | ORAL | Status: AC | PRN

## 2015-04-22 MED ORDER — PROMETHAZINE HCL 25 MG PO TABS: 25.0000 mg | ORAL_TABLET | Freq: Four times a day (QID) | ORAL | Status: AC | PRN

## 2015-04-22 MED ORDER — ONDANSETRON HCL 4 MG/2ML IJ SOLN
4.0000 mg | Freq: Once | INTRAMUSCULAR | Status: AC
  Administered 2015-04-22: 4 mg via INTRAVENOUS
  Filled 2015-04-22: qty 2

## 2015-04-22 NOTE — ED Notes (Signed)
MD at bedside. 
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
## 2015-04-22 NOTE — Discharge Instructions (Signed)
Follow-up with your doctors. Recommended clear liquid diet for the next 24 hours and a bland diet. Return for any new or worse symptoms. Take antinausea medicine as directed take pain medicine as needed.

## 2015-04-22 NOTE — ED Notes (Signed)
abd pain, nausea x today-pt states "pancreatitis"-NAD-steady gait

## 2015-05-27 ENCOUNTER — Inpatient Hospital Stay: Payer: 59 | Admitting: Internal Medicine

## 2015-08-08 ENCOUNTER — Emergency Department (HOSPITAL_BASED_OUTPATIENT_CLINIC_OR_DEPARTMENT_OTHER)
Admission: EM | Admit: 2015-08-08 | Discharge: 2015-08-08 | Disposition: A | Discharge status: Home / Self Care | Payer: 59 | Attending: Emergency Medicine | Admitting: Emergency Medicine

## 2015-08-08 ENCOUNTER — Encounter (HOSPITAL_BASED_OUTPATIENT_CLINIC_OR_DEPARTMENT_OTHER): Payer: Self-pay | Admitting: *Deleted

## 2015-08-08 ENCOUNTER — Emergency Department (HOSPITAL_BASED_OUTPATIENT_CLINIC_OR_DEPARTMENT_OTHER): Payer: 59

## 2015-08-08 DIAGNOSIS — M549 Dorsalgia, unspecified: Secondary | ICD-10-CM | POA: Diagnosis not present

## 2015-08-08 DIAGNOSIS — R109 Unspecified abdominal pain: Secondary | ICD-10-CM | POA: Diagnosis present

## 2015-08-08 LAB — URINALYSIS, ROUTINE W REFLEX MICROSCOPIC
BILIRUBIN URINE: NEGATIVE
Glucose, UA: NEGATIVE mg/dL
Hgb urine dipstick: NEGATIVE
Ketones, ur: NEGATIVE mg/dL
Leukocytes, UA: NEGATIVE
NITRITE: NEGATIVE
Protein, ur: 30 mg/dL — AB
SPECIFIC GRAVITY, URINE: 1.014 (ref 1.005–1.030)
pH: 6 (ref 5.0–8.0)

## 2015-08-08 LAB — CBC WITH DIFFERENTIAL/PLATELET
Basophils Absolute: 0 10*3/uL (ref 0.0–0.1)
Basophils Relative: 1 %
Eosinophils Absolute: 0.1 10*3/uL (ref 0.0–0.7)
Eosinophils Relative: 2 %
HCT: 42.1 % (ref 39.0–52.0)
Hemoglobin: 14.7 g/dL (ref 13.0–17.0)
Lymphocytes Relative: 46 %
Lymphs Abs: 1.6 10*3/uL (ref 0.7–4.0)
MCH: 30.8 pg (ref 26.0–34.0)
MCHC: 34.9 g/dL (ref 30.0–36.0)
MCV: 88.1 fL (ref 78.0–100.0)
Monocytes Absolute: 0.2 10*3/uL (ref 0.1–1.0)
Monocytes Relative: 5 %
Neutro Abs: 1.6 10*3/uL — ABNORMAL LOW (ref 1.7–7.7)
Neutrophils Relative %: 46 %
Platelets: 130 10*3/uL — ABNORMAL LOW (ref 150–400)
RBC: 4.78 MIL/uL (ref 4.22–5.81)
RDW: 12.9 % (ref 11.5–15.5)
WBC: 3.5 10*3/uL — ABNORMAL LOW (ref 4.0–10.5)

## 2015-08-08 LAB — BASIC METABOLIC PANEL
Anion gap: 8 (ref 5–15)
BUN: 9 mg/dL (ref 6–20)
CHLORIDE: 104 mmol/L (ref 101–111)
CO2: 27 mmol/L (ref 22–32)
Calcium: 9.5 mg/dL (ref 8.9–10.3)
Creatinine, Ser: 1.1 mg/dL (ref 0.61–1.24)
Glucose, Bld: 83 mg/dL (ref 65–99)
Potassium: 4.2 mmol/L (ref 3.5–5.1)
SODIUM: 139 mmol/L (ref 135–145)

## 2015-08-08 LAB — URINE MICROSCOPIC-ADD ON
RBC / HPF: NONE SEEN RBC/hpf (ref 0–5)
SQUAMOUS EPITHELIAL / LPF: NONE SEEN
WBC UA: NONE SEEN WBC/hpf (ref 0–5)

## 2015-08-08 NOTE — ED Notes (Signed)
Patient transported to CT 
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
## 2015-08-08 NOTE — ED Triage Notes (Signed)
Patient c/o L side flank pain that has been hurting for the past week. No medications taken, no n/v

## 2015-08-14 ENCOUNTER — Emergency Department (HOSPITAL_BASED_OUTPATIENT_CLINIC_OR_DEPARTMENT_OTHER)
Admission: EM | Admit: 2015-08-14 | Discharge: 2015-08-14 | Disposition: A | Discharge status: Home / Self Care | Payer: 59 | Attending: Emergency Medicine | Admitting: Emergency Medicine

## 2015-08-14 ENCOUNTER — Encounter (HOSPITAL_BASED_OUTPATIENT_CLINIC_OR_DEPARTMENT_OTHER): Payer: Self-pay | Admitting: *Deleted

## 2015-08-14 DIAGNOSIS — K859 Acute pancreatitis without necrosis or infection, unspecified: Secondary | ICD-10-CM | POA: Diagnosis not present

## 2015-08-14 DIAGNOSIS — R109 Unspecified abdominal pain: Secondary | ICD-10-CM | POA: Diagnosis present

## 2015-08-14 LAB — CBC WITH DIFFERENTIAL/PLATELET
BASOS ABS: 0 10*3/uL (ref 0.0–0.1)
BASOS PCT: 0 %
EOS PCT: 1 %
Eosinophils Absolute: 0.1 10*3/uL (ref 0.0–0.7)
HCT: 40.2 % (ref 39.0–52.0)
HEMOGLOBIN: 13.6 g/dL (ref 13.0–17.0)
Lymphocytes Relative: 25 %
Lymphs Abs: 1.7 10*3/uL (ref 0.7–4.0)
MCH: 30.4 pg (ref 26.0–34.0)
MCHC: 33.8 g/dL (ref 30.0–36.0)
MCV: 89.7 fL (ref 78.0–100.0)
MONO ABS: 0.4 10*3/uL (ref 0.1–1.0)
Monocytes Relative: 6 %
Neutro Abs: 4.6 10*3/uL (ref 1.7–7.7)
Neutrophils Relative %: 68 %
Platelets: 133 10*3/uL — ABNORMAL LOW (ref 150–400)
RBC: 4.48 MIL/uL (ref 4.22–5.81)
RDW: 13.1 % (ref 11.5–15.5)
WBC: 6.8 10*3/uL (ref 4.0–10.5)

## 2015-08-14 LAB — BASIC METABOLIC PANEL
Anion gap: 5 (ref 5–15)
BUN: 11 mg/dL (ref 6–20)
CALCIUM: 9.3 mg/dL (ref 8.9–10.3)
CHLORIDE: 104 mmol/L (ref 101–111)
CO2: 29 mmol/L (ref 22–32)
CREATININE: 1.13 mg/dL (ref 0.61–1.24)
GFR calc non Af Amer: >60 mL/min (ref >60)
Glucose, Bld: 97 mg/dL (ref 65–99)
Potassium: 3.6 mmol/L (ref 3.5–5.1)
SODIUM: 138 mmol/L (ref 135–145)

## 2015-08-14 LAB — HEPATIC FUNCTION PANEL
ALT: 13 U/L — ABNORMAL LOW (ref 17–63)
AST: 22 U/L (ref 15–41)
Albumin: 4.2 g/dL (ref 3.5–5.0)
Alkaline Phosphatase: 52 U/L (ref 38–126)
BILIRUBIN INDIRECT: 0.6 mg/dL (ref 0.3–0.9)
Bilirubin, Direct: 0.1 mg/dL (ref 0.1–0.5)
TOTAL PROTEIN: 8.1 g/dL (ref 6.5–8.1)
Total Bilirubin: 0.7 mg/dL (ref 0.3–1.2)

## 2015-08-14 LAB — LIPASE, BLOOD: LIPASE: 325 U/L — AB (ref 11–51)

## 2015-08-14 MED ORDER — HYDROMORPHONE HCL 1 MG/ML IJ SOLN
1.0000 mg | Freq: Once | INTRAMUSCULAR | Status: AC
  Administered 2015-08-14: 1 mg via INTRAVENOUS
  Filled 2015-08-14: qty 1

## 2015-08-14 MED ORDER — HYDROMORPHONE HCL 1 MG/ML IJ SOLN
1.0000 mg | Freq: Once | INTRAMUSCULAR | Status: AC
  Administered 2015-08-14: 1 mg via INTRAVENOUS

## 2015-08-14 MED ORDER — OXYCODONE HCL 5 MG PO TABS: 10.0000 mg | ORAL_TABLET | Freq: Four times a day (QID) | ORAL | 0 refills | Status: AC | PRN

## 2015-08-14 MED ORDER — HYDROMORPHONE HCL 1 MG/ML IJ SOLN
INTRAMUSCULAR | Status: AC
  Filled 2015-08-14: qty 1

## 2015-08-14 MED ORDER — ONDANSETRON HCL 4 MG PO TABS
4.0000 mg | ORAL_TABLET | Freq: Four times a day (QID) | ORAL | 0 refills | Status: AC
Start: 2015-08-14 — End: ?

## 2015-08-14 MED ORDER — SODIUM CHLORIDE 0.9 % IV BOLUS (SEPSIS)
1000.0000 mL | Freq: Once | INTRAVENOUS | Status: AC
  Administered 2015-08-14: 1000 mL via INTRAVENOUS

## 2015-08-14 MED ORDER — ONDANSETRON HCL 4 MG/2ML IJ SOLN
4.0000 mg | Freq: Once | INTRAMUSCULAR | Status: AC
  Administered 2015-08-14: 4 mg via INTRAVENOUS
  Filled 2015-08-14: qty 2

## 2015-08-14 NOTE — ED Notes (Signed)
Pt given warm blanket and TV channel adjusted.

## 2015-08-14 NOTE — ED Notes (Signed)
Pt informed he was receiving  of dilaudid. Pt states "she is going to need to adjust that, I took 8 mg of dilaudid before I came."

## 2015-08-14 NOTE — ED Notes (Signed)
Mr. Alver FisherShaw's wife called and requested to speak with the doctor who is taking care of her husband.  Informed Dr. Verdie MosherLiu of call, and transferred the call to her.
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
## 2015-08-14 NOTE — ED Notes (Signed)
Patient began to argue about his pain and was informed that when his ride comes in, we will give him the dose of Dilaudid that has been ordered for him.  Demanded to talk with the Unit Director, because if we are not treating his pain, that he should not be charged for this visit.  [name], AD to see patient.

## 2015-08-14 NOTE — Discharge Instructions (Signed)
Please follow-up with your GI doctor and PCP regarding further pain management. Return without fail for worsening symptoms, including fever, intractable vomiting, difficulty breathing or any other symptoms concerning to you.

## 2015-08-14 NOTE — ED Notes (Addendum)
Pt becoming very argumentative about only receiving  of Dilaudid at a time. Pt states he normally gets  of dilaudid at a time. Previous visits reviewed with pt and informed that previous doses were  at a time. Pt argued that previous charts were wrong. Pt was prescribed dilaudid  PO for home, and I educated pt that po dosages and IV dosages were different and taking into consideration that he had already taken  po at home, it was necessary to give additional IV medication slowly to avoid oversedation.

## 2015-08-14 NOTE — ED Triage Notes (Signed)
C/o abd pain radiating into back onset last pm w nausea,  Denies vomiting or diarrhea denies ua sx

## 2015-08-14 NOTE — ED Notes (Signed)
MD at bedside. 
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
